# Patient Record
Sex: Female | Born: 1970 | Race: White | Hispanic: No | Smoking: Current every day smoker
Health system: Southern US, Community
[De-identification: ages and names within clinical notes are randomized; demographics above are authoritative.]

## PROBLEM LIST (undated history)

## (undated) DIAGNOSIS — N159 Renal tubulo-interstitial disease, unspecified: Secondary | ICD-10-CM

## (undated) DIAGNOSIS — I10 Essential (primary) hypertension: Secondary | ICD-10-CM

## (undated) DIAGNOSIS — E785 Hyperlipidemia, unspecified: Secondary | ICD-10-CM

## (undated) DIAGNOSIS — I38 Endocarditis, valve unspecified: Secondary | ICD-10-CM

## (undated) DIAGNOSIS — I341 Nonrheumatic mitral (valve) prolapse: Secondary | ICD-10-CM

## (undated) DIAGNOSIS — R55 Syncope and collapse: Secondary | ICD-10-CM

## (undated) DIAGNOSIS — F4310 Post-traumatic stress disorder, unspecified: Secondary | ICD-10-CM

## (undated) DIAGNOSIS — I509 Heart failure, unspecified: Secondary | ICD-10-CM

## (undated) DIAGNOSIS — F419 Anxiety disorder, unspecified: Secondary | ICD-10-CM

## (undated) DIAGNOSIS — G43909 Migraine, unspecified, not intractable, without status migrainosus: Secondary | ICD-10-CM

## (undated) HISTORY — PX: BACK SURGERY: SHX140

## (undated) HISTORY — PX: ABDOMINAL HYSTERECTOMY: SHX81

## (undated) HISTORY — PX: CARPAL TUNNEL RELEASE: SHX101

## (undated) HISTORY — PX: WRIST GANGLION EXCISION: SHX840

## (undated) HISTORY — PX: TUBAL LIGATION: SHX77

## (undated) HISTORY — PX: ESOPHAGOGASTRODUODENOSCOPY: SHX1529

---

## 1998-10-05 ENCOUNTER — Emergency Department (HOSPITAL_COMMUNITY): Admission: EM | Admit: 1998-10-05 | Discharge: 1998-10-05 | Payer: Self-pay | Admitting: Emergency Medicine

## 1998-10-10 ENCOUNTER — Encounter (HOSPITAL_COMMUNITY): Admission: RE | Admit: 1998-10-10 | Discharge: 1999-01-08 | Payer: Self-pay

## 1998-12-07 ENCOUNTER — Emergency Department (HOSPITAL_COMMUNITY): Admission: EM | Admit: 1998-12-07 | Discharge: 1998-12-07 | Payer: Self-pay | Admitting: Emergency Medicine

## 1998-12-07 ENCOUNTER — Encounter: Payer: Self-pay | Admitting: Emergency Medicine

## 2004-08-24 ENCOUNTER — Ambulatory Visit: Payer: Self-pay

## 2004-09-22 ENCOUNTER — Emergency Department: Payer: Self-pay | Admitting: Emergency Medicine

## 2004-11-06 ENCOUNTER — Emergency Department: Payer: Self-pay | Admitting: Emergency Medicine

## 2005-04-27 ENCOUNTER — Other Ambulatory Visit: Payer: Self-pay

## 2005-04-27 ENCOUNTER — Emergency Department: Payer: Self-pay | Admitting: Emergency Medicine

## 2005-08-28 ENCOUNTER — Emergency Department: Payer: Self-pay | Admitting: Emergency Medicine

## 2005-10-17 ENCOUNTER — Other Ambulatory Visit: Payer: Self-pay

## 2005-10-17 ENCOUNTER — Emergency Department: Payer: Self-pay | Admitting: Emergency Medicine

## 2006-03-12 ENCOUNTER — Emergency Department: Payer: Self-pay | Admitting: Internal Medicine

## 2006-10-19 ENCOUNTER — Emergency Department: Payer: Self-pay | Admitting: Emergency Medicine

## 2007-01-21 ENCOUNTER — Emergency Department: Payer: Self-pay | Admitting: Emergency Medicine

## 2007-10-08 ENCOUNTER — Emergency Department (HOSPITAL_COMMUNITY): Admission: EM | Admit: 2007-10-08 | Discharge: 2007-10-08 | Payer: Self-pay | Admitting: Emergency Medicine

## 2008-03-20 ENCOUNTER — Emergency Department: Payer: Self-pay | Admitting: Emergency Medicine

## 2008-06-27 ENCOUNTER — Emergency Department (HOSPITAL_COMMUNITY): Admission: EM | Admit: 2008-06-27 | Discharge: 2008-06-28 | Payer: Self-pay | Admitting: Emergency Medicine

## 2008-09-29 ENCOUNTER — Emergency Department: Payer: Self-pay | Admitting: Emergency Medicine

## 2008-11-23 ENCOUNTER — Emergency Department: Payer: Self-pay | Admitting: Emergency Medicine

## 2008-12-08 ENCOUNTER — Emergency Department: Payer: Self-pay | Admitting: Emergency Medicine

## 2009-01-05 ENCOUNTER — Emergency Department: Payer: Self-pay | Admitting: Emergency Medicine

## 2009-01-25 ENCOUNTER — Emergency Department: Payer: Self-pay | Admitting: Emergency Medicine

## 2009-01-28 ENCOUNTER — Emergency Department: Payer: Self-pay | Admitting: Emergency Medicine

## 2009-03-03 ENCOUNTER — Emergency Department: Payer: Self-pay | Admitting: Emergency Medicine

## 2009-03-09 ENCOUNTER — Emergency Department: Payer: Self-pay | Admitting: Emergency Medicine

## 2009-03-31 ENCOUNTER — Inpatient Hospital Stay: Payer: Self-pay | Admitting: Psychiatry

## 2009-04-01 ENCOUNTER — Ambulatory Visit: Payer: Self-pay | Admitting: Cardiology

## 2009-04-23 ENCOUNTER — Emergency Department (HOSPITAL_COMMUNITY): Admission: EM | Admit: 2009-04-23 | Discharge: 2009-04-23 | Payer: Self-pay | Admitting: Emergency Medicine

## 2009-06-07 ENCOUNTER — Emergency Department: Payer: Self-pay | Admitting: Emergency Medicine

## 2009-07-17 ENCOUNTER — Emergency Department: Payer: Self-pay | Admitting: Emergency Medicine

## 2009-11-23 ENCOUNTER — Inpatient Hospital Stay (HOSPITAL_COMMUNITY): Admission: EM | Admit: 2009-11-23 | Discharge: 2009-11-25 | Payer: Self-pay | Admitting: Emergency Medicine

## 2010-01-01 ENCOUNTER — Emergency Department: Payer: Self-pay | Admitting: Emergency Medicine

## 2010-02-27 ENCOUNTER — Emergency Department: Payer: Self-pay | Admitting: Emergency Medicine

## 2010-03-11 ENCOUNTER — Inpatient Hospital Stay: Payer: Self-pay | Admitting: Psychiatry

## 2010-04-03 ENCOUNTER — Ambulatory Visit: Payer: Self-pay | Admitting: Orthopedic Surgery

## 2010-04-05 ENCOUNTER — Ambulatory Visit: Payer: Self-pay | Admitting: Orthopedic Surgery

## 2010-05-09 ENCOUNTER — Ambulatory Visit: Payer: Self-pay

## 2010-05-17 ENCOUNTER — Ambulatory Visit: Payer: Self-pay | Admitting: Family Medicine

## 2010-07-30 ENCOUNTER — Emergency Department: Payer: Self-pay | Admitting: Emergency Medicine

## 2010-08-14 ENCOUNTER — Emergency Department: Payer: Self-pay | Admitting: Emergency Medicine

## 2010-10-07 ENCOUNTER — Emergency Department: Payer: Self-pay | Admitting: Emergency Medicine

## 2010-10-30 ENCOUNTER — Ambulatory Visit: Payer: Self-pay | Admitting: Gastroenterology

## 2010-10-31 LAB — PATHOLOGY REPORT

## 2010-11-06 ENCOUNTER — Emergency Department: Payer: Self-pay | Admitting: Emergency Medicine

## 2011-01-28 ENCOUNTER — Inpatient Hospital Stay: Payer: Self-pay | Admitting: Psychiatry

## 2011-01-28 DIAGNOSIS — I499 Cardiac arrhythmia, unspecified: Secondary | ICD-10-CM

## 2011-01-29 ENCOUNTER — Inpatient Hospital Stay: Payer: Self-pay | Admitting: Internal Medicine

## 2011-01-29 DIAGNOSIS — R55 Syncope and collapse: Secondary | ICD-10-CM

## 2011-02-03 LAB — COMPREHENSIVE METABOLIC PANEL
AST: 22 U/L (ref 0–37)
Albumin: 3.3 g/dL — ABNORMAL LOW (ref 3.5–5.2)
Alkaline Phosphatase: 157 U/L — ABNORMAL HIGH (ref 39–117)
BUN: 16 mg/dL (ref 6–23)
Chloride: 104 mEq/L (ref 96–112)
GFR calc Af Amer: >60 mL/min (ref >60)
Potassium: 4.7 mEq/L (ref 3.5–5.1)
Sodium: 139 mEq/L (ref 135–145)
Total Protein: 6.9 g/dL (ref 6.0–8.3)

## 2011-02-03 LAB — CK TOTAL AND CKMB (NOT AT ARMC)
CK, MB: 0.9 ng/mL (ref 0.3–4.0)
Relative Index: INVALID (ref 0.0–2.5)
Total CK: 49 U/L (ref 7–177)

## 2011-02-03 LAB — HEPATIC FUNCTION PANEL
ALT: 132 U/L — ABNORMAL HIGH (ref 0–35)
ALT: 145 U/L — ABNORMAL HIGH (ref 0–35)
AST: 30 U/L (ref 0–37)
AST: 32 U/L (ref 0–37)
Albumin: 3.4 g/dL — ABNORMAL LOW (ref 3.5–5.2)
Alkaline Phosphatase: 202 U/L — ABNORMAL HIGH (ref 39–117)
Alkaline Phosphatase: 209 U/L — ABNORMAL HIGH (ref 39–117)
Total Bilirubin: 0.3 mg/dL (ref 0.3–1.2)
Total Bilirubin: 0.3 mg/dL (ref 0.3–1.2)
Total Protein: 7.4 g/dL (ref 6.0–8.3)
Total Protein: 7.5 g/dL (ref 6.0–8.3)

## 2011-02-03 LAB — POCT I-STAT, CHEM 8
BUN: 8 mg/dL (ref 6–23)
Calcium, Ion: 1.11 mmol/L — ABNORMAL LOW (ref 1.12–1.32)
Glucose, Bld: 123 mg/dL — ABNORMAL HIGH (ref 70–99)
Hemoglobin: 14.6 g/dL (ref 12.0–15.0)
Potassium: 4.1 mEq/L (ref 3.5–5.1)
Sodium: 137 mEq/L (ref 135–145)

## 2011-02-03 LAB — URINALYSIS, ROUTINE W REFLEX MICROSCOPIC
Hgb urine dipstick: NEGATIVE
Ketones, ur: NEGATIVE mg/dL
Protein, ur: NEGATIVE mg/dL
Specific Gravity, Urine: 1.01 (ref 1.005–1.030)
Urobilinogen, UA: 0.2 mg/dL (ref 0.0–1.0)
pH: 6.5 (ref 5.0–8.0)

## 2011-02-03 LAB — DIFFERENTIAL
Basophils Relative: 1 % (ref 0–1)
Eosinophils Absolute: 0.2 10*3/uL (ref 0.0–0.7)
Monocytes Relative: 6 % (ref 3–12)

## 2011-02-03 LAB — CBC
MCHC: 35.3 g/dL (ref 30.0–36.0)
MCV: 93.5 fL (ref 78.0–100.0)
RBC: 4.39 MIL/uL (ref 3.87–5.11)
WBC: 6.3 10*3/uL (ref 4.0–10.5)

## 2011-02-03 LAB — CULTURE, BLOOD (ROUTINE X 2)

## 2011-02-03 LAB — URINE CULTURE

## 2011-02-03 LAB — PREGNANCY, URINE: Preg Test, Ur: NEGATIVE

## 2011-02-03 LAB — CARDIAC PANEL(CRET KIN+CKTOT+MB+TROPI)
CK, MB: 1.3 ng/mL (ref 0.3–4.0)
Relative Index: INVALID (ref 0.0–2.5)
Total CK: 49 U/L (ref 7–177)

## 2011-02-03 LAB — PROTIME-INR: Prothrombin Time: 12.8 seconds (ref 11.6–15.2)

## 2011-02-03 LAB — RHEUMATOID FACTOR: Rhuematoid fact SerPl-aCnc: <20 IU/mL (ref 0–20)

## 2011-02-03 LAB — HIV ANTIBODY (ROUTINE TESTING W REFLEX): HIV: NONREACTIVE

## 2011-02-03 LAB — SEDIMENTATION RATE: Sed Rate: 69 mm/hr — ABNORMAL HIGH (ref 0–22)

## 2011-02-03 LAB — TSH: TSH: 1.148 u[IU]/mL (ref 0.350–4.500)

## 2011-02-03 LAB — BRAIN NATRIURETIC PEPTIDE: Pro B Natriuretic peptide (BNP): <30 pg/mL (ref 0.0–100.0)

## 2011-02-18 ENCOUNTER — Emergency Department: Payer: Self-pay | Admitting: Emergency Medicine

## 2011-02-25 LAB — POCT I-STAT, CHEM 8
Chloride: 108 mEq/L (ref 96–112)
HCT: 38 % (ref 36.0–46.0)
Hemoglobin: 12.9 g/dL (ref 12.0–15.0)
Potassium: 3.5 mEq/L (ref 3.5–5.1)
Sodium: 140 mEq/L (ref 135–145)

## 2011-02-25 LAB — CBC
HCT: 37.8 % (ref 36.0–46.0)
Hemoglobin: 13.3 g/dL (ref 12.0–15.0)
MCV: 91.5 fL (ref 78.0–100.0)
RDW: 12.8 % (ref 11.5–15.5)

## 2011-02-25 LAB — DIFFERENTIAL
Basophils Absolute: 0.1 10*3/uL (ref 0.0–0.1)
Eosinophils Absolute: 0.2 10*3/uL (ref 0.0–0.7)
Eosinophils Relative: 2 % (ref 0–5)
Lymphocytes Relative: 38 % (ref 12–46)
Lymphs Abs: 3.3 10*3/uL (ref 0.7–4.0)
Monocytes Absolute: 0.5 10*3/uL (ref 0.1–1.0)

## 2011-02-25 LAB — URINALYSIS, ROUTINE W REFLEX MICROSCOPIC
Bilirubin Urine: NEGATIVE
Ketones, ur: 15 mg/dL — AB
Nitrite: NEGATIVE
Protein, ur: NEGATIVE mg/dL
Urobilinogen, UA: 0.2 mg/dL (ref 0.0–1.0)

## 2011-02-25 LAB — RAPID URINE DRUG SCREEN, HOSP PERFORMED
Cocaine: NOT DETECTED
Tetrahydrocannabinol: POSITIVE — AB

## 2011-02-25 LAB — GLUCOSE, CAPILLARY

## 2011-02-25 LAB — MAGNESIUM: Magnesium: 1.9 mg/dL (ref 1.5–2.5)

## 2011-02-25 LAB — ETHANOL: Alcohol, Ethyl (B): <5 mg/dL (ref 0–10)

## 2011-02-25 LAB — TROPONIN I: Troponin I: 0.01 ng/mL (ref 0.00–0.06)

## 2011-04-07 ENCOUNTER — Emergency Department: Payer: Self-pay | Admitting: Emergency Medicine

## 2011-05-10 ENCOUNTER — Ambulatory Visit: Payer: Self-pay | Admitting: Family Medicine

## 2011-05-11 ENCOUNTER — Observation Stay: Payer: Self-pay | Admitting: Internal Medicine

## 2011-05-15 ENCOUNTER — Emergency Department: Payer: Self-pay | Admitting: Emergency Medicine

## 2011-07-24 ENCOUNTER — Ambulatory Visit: Payer: Self-pay | Admitting: Orthopedic Surgery

## 2011-08-16 LAB — CBC
HCT: 38.4
Hemoglobin: 13.2
MCHC: 34.4
MCV: 92.3
Platelets: 201
RBC: 4.16
RDW: 12.9
WBC: 8.5

## 2011-08-16 LAB — URINALYSIS, ROUTINE W REFLEX MICROSCOPIC
Glucose, UA: NEGATIVE
Hgb urine dipstick: NEGATIVE
Ketones, ur: NEGATIVE
Nitrite: NEGATIVE
Protein, ur: NEGATIVE
Specific Gravity, Urine: 1.026
Urobilinogen, UA: 1
pH: 6

## 2011-08-16 LAB — POCT I-STAT, CHEM 8
BUN: 10
Calcium, Ion: 1.22
Chloride: 106
Creatinine, Ser: 1
Glucose, Bld: 97
HCT: 38
Hemoglobin: 12.9
Potassium: 4.5
Sodium: 138
TCO2: 27

## 2011-08-16 LAB — DIFFERENTIAL
Basophils Absolute: 0.1
Basophils Relative: 1
Eosinophils Absolute: 0.2
Eosinophils Relative: 3
Lymphocytes Relative: 43
Lymphs Abs: 3.7
Monocytes Absolute: 0.4
Monocytes Relative: 5
Neutro Abs: 4.1
Neutrophils Relative %: 48

## 2011-08-16 LAB — POCT CARDIAC MARKERS
CKMB, poc: <1 — ABNORMAL LOW
Myoglobin, poc: 45.4
Troponin i, poc: <0.05

## 2011-08-27 LAB — COMPREHENSIVE METABOLIC PANEL
ALT: 17
AST: 16
CO2: 25
Chloride: 106
GFR calc Af Amer: >60
GFR calc non Af Amer: >60
Potassium: 4
Sodium: 138
Total Bilirubin: 0.7

## 2011-08-27 LAB — CBC
RBC: 4.6
WBC: 8.6

## 2011-08-27 LAB — DIFFERENTIAL
Basophils Absolute: 0.1
Eosinophils Absolute: 0.4
Eosinophils Relative: 5
Monocytes Absolute: 0.4

## 2011-08-27 LAB — POCT CARDIAC MARKERS
CKMB, poc: <1 — ABNORMAL LOW
Myoglobin, poc: 39.8
Troponin i, poc: <0.05

## 2011-12-19 ENCOUNTER — Ambulatory Visit: Payer: Self-pay | Admitting: Unknown Physician Specialty

## 2011-12-19 LAB — URINALYSIS, COMPLETE
Bacteria: NONE SEEN
Bilirubin,UR: NEGATIVE
Blood: NEGATIVE
Ketone: NEGATIVE
Leukocyte Esterase: NEGATIVE
Ph: 5 (ref 4.5–8.0)
RBC,UR: <1 /HPF (ref 0–5)
Specific Gravity: 1.015 (ref 1.003–1.030)
Squamous Epithelial: <1
WBC UR: 1 /HPF (ref 0–5)

## 2011-12-19 LAB — CBC
HCT: 42.3 % (ref 35.0–47.0)
HGB: 14.4 g/dL (ref 12.0–16.0)
MCHC: 34.1 g/dL (ref 32.0–36.0)
RBC: 4.52 10*6/uL (ref 3.80–5.20)
WBC: 8.7 10*3/uL (ref 3.6–11.0)

## 2011-12-19 LAB — BASIC METABOLIC PANEL
Anion Gap: 11 (ref 7–16)
BUN: 8 mg/dL (ref 7–18)
Chloride: 105 mmol/L (ref 98–107)
Creatinine: 0.76 mg/dL (ref 0.60–1.30)
EGFR (African American): >60

## 2011-12-26 ENCOUNTER — Ambulatory Visit: Payer: Self-pay | Admitting: Unknown Physician Specialty

## 2012-09-30 ENCOUNTER — Emergency Department: Payer: Self-pay | Admitting: Emergency Medicine

## 2012-09-30 LAB — COMPREHENSIVE METABOLIC PANEL
Albumin: 3.6 g/dL (ref 3.4–5.0)
Anion Gap: 9 (ref 7–16)
Calcium, Total: 9.1 mg/dL (ref 8.5–10.1)
Chloride: 105 mmol/L (ref 98–107)
EGFR (African American): >60
Osmolality: 277 (ref 275–301)
Potassium: 3.9 mmol/L (ref 3.5–5.1)
Sodium: 139 mmol/L (ref 136–145)
Total Protein: 7.7 g/dL (ref 6.4–8.2)

## 2012-09-30 LAB — CBC
HGB: 14.8 g/dL (ref 12.0–16.0)
MCHC: 35.2 g/dL (ref 32.0–36.0)
Platelet: 201 10*3/uL (ref 150–440)
RBC: 4.52 10*6/uL (ref 3.80–5.20)
RDW: 12.4 % (ref 11.5–14.5)
WBC: 9.6 10*3/uL (ref 3.6–11.0)

## 2012-09-30 LAB — URINALYSIS, COMPLETE
Bacteria: NONE SEEN
Bilirubin,UR: NEGATIVE
Blood: NEGATIVE
Glucose,UR: NEGATIVE mg/dL (ref 0–75)
Leukocyte Esterase: NEGATIVE
Nitrite: NEGATIVE
Ph: 5 (ref 4.5–8.0)
RBC,UR: 1 /HPF (ref 0–5)
Squamous Epithelial: 1

## 2012-09-30 LAB — CK TOTAL AND CKMB (NOT AT ARMC)
CK, Total: 79 U/L (ref 21–215)
CK-MB: 0.5 ng/mL (ref 0.5–3.6)

## 2012-11-04 ENCOUNTER — Emergency Department: Payer: Self-pay | Admitting: Emergency Medicine

## 2012-11-04 LAB — URINALYSIS, COMPLETE
Leukocyte Esterase: NEGATIVE
Nitrite: NEGATIVE
Squamous Epithelial: 3

## 2012-11-04 LAB — COMPREHENSIVE METABOLIC PANEL
Albumin: 4 g/dL (ref 3.4–5.0)
Alkaline Phosphatase: 123 U/L (ref 50–136)
Anion Gap: 2 — ABNORMAL LOW (ref 7–16)
Calcium, Total: 9.5 mg/dL (ref 8.5–10.1)
EGFR (Non-African Amer.): >60
Glucose: 109 mg/dL — ABNORMAL HIGH (ref 65–99)
Potassium: 3.9 mmol/L (ref 3.5–5.1)
SGOT(AST): 18 U/L (ref 15–37)
Sodium: 137 mmol/L (ref 136–145)

## 2012-11-04 LAB — CBC
HGB: 14.1 g/dL (ref 12.0–16.0)
MCV: 92 fL (ref 80–100)
Platelet: 258 10*3/uL (ref 150–440)
RBC: 4.62 10*6/uL (ref 3.80–5.20)
WBC: 8.4 10*3/uL (ref 3.6–11.0)

## 2013-05-11 ENCOUNTER — Encounter (HOSPITAL_COMMUNITY): Payer: Self-pay

## 2013-05-11 ENCOUNTER — Other Ambulatory Visit: Payer: Self-pay

## 2013-05-11 ENCOUNTER — Emergency Department (HOSPITAL_COMMUNITY): Payer: Medicaid Other

## 2013-05-11 ENCOUNTER — Emergency Department (HOSPITAL_COMMUNITY)
Admission: EM | Admit: 2013-05-11 | Discharge: 2013-05-12 | Disposition: A | Discharge status: Home / Self Care | Payer: Medicaid Other | Attending: Emergency Medicine | Admitting: Emergency Medicine

## 2013-05-11 DIAGNOSIS — Y9269 Other specified industrial and construction area as the place of occurrence of the external cause: Secondary | ICD-10-CM | POA: Insufficient documentation

## 2013-05-11 DIAGNOSIS — Y990 Civilian activity done for income or pay: Secondary | ICD-10-CM | POA: Insufficient documentation

## 2013-05-11 DIAGNOSIS — Y939 Activity, unspecified: Secondary | ICD-10-CM | POA: Insufficient documentation

## 2013-05-11 DIAGNOSIS — R55 Syncope and collapse: Secondary | ICD-10-CM | POA: Insufficient documentation

## 2013-05-11 DIAGNOSIS — I10 Essential (primary) hypertension: Secondary | ICD-10-CM | POA: Insufficient documentation

## 2013-05-11 DIAGNOSIS — Z3202 Encounter for pregnancy test, result negative: Secondary | ICD-10-CM | POA: Insufficient documentation

## 2013-05-11 DIAGNOSIS — F17200 Nicotine dependence, unspecified, uncomplicated: Secondary | ICD-10-CM | POA: Insufficient documentation

## 2013-05-11 DIAGNOSIS — Z8679 Personal history of other diseases of the circulatory system: Secondary | ICD-10-CM | POA: Insufficient documentation

## 2013-05-11 DIAGNOSIS — S069X9A Unspecified intracranial injury with loss of consciousness of unspecified duration, initial encounter: Secondary | ICD-10-CM

## 2013-05-11 DIAGNOSIS — R296 Repeated falls: Secondary | ICD-10-CM | POA: Insufficient documentation

## 2013-05-11 DIAGNOSIS — Z8659 Personal history of other mental and behavioral disorders: Secondary | ICD-10-CM | POA: Insufficient documentation

## 2013-05-11 DIAGNOSIS — S060X1A Concussion with loss of consciousness of 30 minutes or less, initial encounter: Secondary | ICD-10-CM | POA: Insufficient documentation

## 2013-05-11 DIAGNOSIS — M542 Cervicalgia: Secondary | ICD-10-CM | POA: Insufficient documentation

## 2013-05-11 HISTORY — DX: Anxiety disorder, unspecified: F41.9

## 2013-05-11 HISTORY — DX: Nonrheumatic mitral (valve) prolapse: I34.1

## 2013-05-11 HISTORY — DX: Essential (primary) hypertension: I10

## 2013-05-11 LAB — POCT I-STAT, CHEM 8
BUN: 13 mg/dL (ref 6–23)
Calcium, Ion: 1.13 mmol/L (ref 1.12–1.23)
Chloride: 104 mEq/L (ref 96–112)
HCT: 40 % (ref 36.0–46.0)
Potassium: 3.7 mEq/L (ref 3.5–5.1)

## 2013-05-11 MED ORDER — SODIUM CHLORIDE 0.9 % IV BOLUS (SEPSIS)
1000.0000 mL | Freq: Once | INTRAVENOUS | Status: AC
  Administered 2013-05-11: 1000 mL via INTRAVENOUS

## 2013-05-11 MED ORDER — METOCLOPRAMIDE HCL 5 MG/ML IJ SOLN
10.0000 mg | Freq: Once | INTRAMUSCULAR | Status: AC
  Administered 2013-05-11: 10 mg via INTRAVENOUS
  Filled 2013-05-11: qty 2

## 2013-05-11 MED ORDER — HYDROMORPHONE HCL PF 1 MG/ML IJ SOLN
1.0000 mg | Freq: Once | INTRAMUSCULAR | Status: AC
  Administered 2013-05-11: 1 mg via INTRAVENOUS
  Filled 2013-05-11: qty 1

## 2013-05-11 MED ORDER — DIPHENHYDRAMINE HCL 50 MG/ML IJ SOLN
50.0000 mg | Freq: Once | INTRAMUSCULAR | Status: AC
  Administered 2013-05-11: 50 mg via INTRAVENOUS
  Filled 2013-05-11: qty 1

## 2013-05-11 MED ORDER — DEXAMETHASONE SODIUM PHOSPHATE 10 MG/ML IJ SOLN
10.0000 mg | Freq: Once | INTRAMUSCULAR | Status: AC
  Administered 2013-05-11: 10 mg via INTRAVENOUS
  Filled 2013-05-11: qty 1

## 2013-05-11 NOTE — ED Notes (Signed)
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 Did not fall or hit her head. Per staff, pt just slumped over briefly on the counter. Patient c/o headache all day. No relief in spite of multiple OTC meds (aleve, BC powder). Describes headache as intense. Located to the left temporal area, left face and left neck. Also has some blurry vision to left eye. +photophobia, nausea and dizziness. Denies phonophobia, vomiting, CP, abd pain or SOB.

## 2013-05-11 NOTE — ED Notes (Signed)
Patient back from CT.

## 2013-05-11 NOTE — ED Notes (Signed)
Patient transported to CT 

## 2013-05-11 NOTE — ED Provider Notes (Signed)
History    CSN: [id number] Arrival date & time 05/11/13  2231  First MD Initiated Contact with Patient 05/11/13 2243     Chief Complaint  Patient presents with  . Loss of Consciousness  . Headache   (Consider location/radiation/quality/duration/timing/severity/associated sxs/prior Treatment) HPI This 42 year old female has chronic neck pain chronic low back pain, used to get headaches much worse than this one and had them intermittently for years but has not had one in several months, gradual onset 4 days headache mostly left-sided throbbing and burning more intense tonight at work and she just remembers waking up on the floor, she has a history of vasovagal syncope multiple times in the past and last time about 6 months ago she states, it is unknown whether or not she hit her head tonight during her syncopal episode, no fever no change in speech vision swallowing or understanding or focal weakness numbness or incoordination tonight   Past Medical History  Diagnosis Date  . Anxiety   . Mitral valve prolapse   . Hypertension    History reviewed. No pertinent past surgical history. No family history on file. History  Substance Use Topics  . Smoking status: Current Every Day Smoker -- 1.50 packs/day    Types: Cigarettes  . Smokeless tobacco: Never Used  . Alcohol Use: Yes   OB History   Grav Para Term Preterm Abortions TAB SAB Ect Mult Living                 Review of Systems 10 Systems reviewed and are negative for acute change except as noted in the HPI. Allergies  Sulfa antibiotics  Home Medications   Current Outpatient Rx  Name  Route  Sig  Dispense  Refill  . acetaminophen (TYLENOL) 325 MG tablet   Oral   Take 650 mg by mouth every 6 (six) hours as needed for pain.         . naproxen sodium (ANAPROX) 220 MG tablet   Oral   Take 220 mg by mouth 2 (two) times daily as needed (for pain).         Marland Kitchen metoCLOPramide (REGLAN) 10 MG tablet   Oral   Take 1 tablet  (10 mg total) by mouth every 6 (six) hours as needed (nausea/headache).   6 tablet   0   . oxyCODONE-acetaminophen (PERCOCET) 5-325 MG per tablet   Oral   Take 2 tablets by mouth every 4 (four) hours as needed for pain.   10 tablet   0    BP 129/65  Pulse 64  Temp(Src) 98.5 F (36.9 C) (Oral)  Resp 18  SpO2 100% Physical Exam  Nursing note and vitals reviewed. Constitutional:  Awake, alert, nontoxic appearance with baseline speech for patient.  HENT:  Head: Atraumatic.  Mouth/Throat: No oropharyngeal exudate.  Eyes: EOM are normal. Pupils are equal, round, and reactive to light. Right eye exhibits no discharge. Left eye exhibits no discharge.  Neck: Neck supple.  Cervical spine tender   Cardiovascular: Normal rate and regular rhythm.   No murmur heard. Pulmonary/Chest: Effort normal and breath sounds normal. No stridor. No respiratory distress. She has no wheezes. She has no rales. She exhibits no tenderness.  Abdominal: Soft. Bowel sounds are normal. She exhibits no mass. There is no tenderness. There is no rebound.  Musculoskeletal: She exhibits no tenderness.  Baseline ROM, moves extremities with no obvious new focal weakness.  Lymphadenopathy:    She has no cervical adenopathy.  Neurological: She  is alert.  Awake, alert, cooperative and aware of situation; motor strength bilaterally; sensation normal to light touch bilaterally; peripheral visual fields full to confrontation; no facial asymmetry; tongue midline; major cranial nerves appear intact; no pronator drift, normal finger to nose bilaterally, gait not initially tested  Skin: No rash noted.  Psychiatric: She has a normal mood and affect.    ED Course  Procedures (including critical care time) ECG: Sinus rhythm, ventricular rate 65, normal axis, normal intervals, no acute ischemic changes noted, no comparison immediately available  Patient walking normally in the emergency department.  Labs Reviewed  URINE  RAPID DRUG SCREEN (HOSP PERFORMED) - Abnormal; Notable for the following:    Cocaine POSITIVE (*)    Tetrahydrocannabinol POSITIVE (*)    All other components within normal limits  GLUCOSE, CAPILLARY  POCT I-STAT, CHEM 8  POCT PREGNANCY, URINE   Dg Chest 2 View  05/13/2013   *RADIOLOGY REPORT*  Clinical Data:  Chest pain and shortness of breath.  CHEST - 2 VIEW  Comparison: 09/07/2012  Findings: The heart size and mediastinal contours are within normal limits.  Both lungs are clear.  The visualized skeletal structures are unremarkable.  IMPRESSION: No active disease.   Original Report Authenticated By: Irish Lack, M.D.   Ct Head Wo Contrast  05/11/2013   *RADIOLOGY REPORT*  Clinical Data:  Loss of consciousness, syncope, headache.  CT HEAD WITHOUT CONTRAST CT CERVICAL SPINE WITHOUT CONTRAST  Technique:  Multidetector CT imaging of the head and cervical spine was performed following the standard protocol without intravenous contrast.  Multiplanar CT image reconstructions of the cervical spine were also generated.  Comparison:  05/05/2012  CT HEAD  Findings: No acute intracranial abnormality.  Specifically, no hemorrhage, hydrocephalus, mass lesion, acute infarction, or significant intracranial injury.  No acute calvarial abnormality. Visualized paranasal sinuses and mastoids clear.  Orbital soft tissues unremarkable.  IMPRESSION: Negative exam  CT CERVICAL SPINE  Findings: Prior anterior fusion at C5-6.  No hardware complicating feature.  Normal alignment.  Prevertebral soft tissues are normal. No fracture.  No epidural or paraspinal hematoma.  IMPRESSION: No acute bony abnormality.  Prior ACDF C5-6.   Original Report Authenticated By: Charlett Nose, M.D.   Ct Cervical Spine Wo Contrast  05/11/2013   *RADIOLOGY REPORT*  Clinical Data:  Loss of consciousness, syncope, headache.  CT HEAD WITHOUT CONTRAST CT CERVICAL SPINE WITHOUT CONTRAST  Technique:  Multidetector CT imaging of the head and cervical  spine was performed following the standard protocol without intravenous contrast.  Multiplanar CT image reconstructions of the cervical spine were also generated.  Comparison:  05/05/2012  CT HEAD  Findings: No acute intracranial abnormality.  Specifically, no hemorrhage, hydrocephalus, mass lesion, acute infarction, or significant intracranial injury.  No acute calvarial abnormality. Visualized paranasal sinuses and mastoids clear.  Orbital soft tissues unremarkable.  IMPRESSION: Negative exam  CT CERVICAL SPINE  Findings: Prior anterior fusion at C5-6.  No hardware complicating feature.  Normal alignment.  Prevertebral soft tissues are normal. No fracture.  No epidural or paraspinal hematoma.  IMPRESSION: No acute bony abnormality.  Prior ACDF C5-6.   Original Report Authenticated By: Charlett Nose, M.D.   1. Headache   2. Syncope   3. Minor head injury with loss of consciousness, initial encounter   4. Chronic neck pain     MDM  I doubt any other EMC precluding discharge at this time including, but not necessarily limited to the following:SAH, CVA, CSI, Vtach.  Hurman Horn,  MD 05/13/13 1756

## 2013-05-12 LAB — RAPID URINE DRUG SCREEN, HOSP PERFORMED
Barbiturates: NOT DETECTED
Cocaine: POSITIVE — AB
Opiates: NOT DETECTED
Tetrahydrocannabinol: POSITIVE — AB

## 2013-05-12 LAB — POCT PREGNANCY, URINE: Preg Test, Ur: NEGATIVE

## 2013-05-12 MED ORDER — METOCLOPRAMIDE HCL 10 MG PO TABS: 10.0000 mg | ORAL_TABLET | Freq: Four times a day (QID) | ORAL | Status: DC | PRN

## 2013-05-12 MED ORDER — OXYCODONE-ACETAMINOPHEN 5-325 MG PO TABS: 2.0000 | ORAL_TABLET | ORAL | Status: DC | PRN

## 2013-05-13 ENCOUNTER — Encounter (HOSPITAL_COMMUNITY): Payer: Self-pay | Admitting: *Deleted

## 2013-05-13 ENCOUNTER — Emergency Department (HOSPITAL_COMMUNITY)
Admission: EM | Admit: 2013-05-13 | Discharge: 2013-05-13 | Disposition: A | Discharge status: Home / Self Care | Payer: Medicaid Other | Attending: Emergency Medicine | Admitting: Emergency Medicine

## 2013-05-13 ENCOUNTER — Emergency Department (HOSPITAL_COMMUNITY): Payer: Medicaid Other

## 2013-05-13 DIAGNOSIS — R112 Nausea with vomiting, unspecified: Secondary | ICD-10-CM | POA: Insufficient documentation

## 2013-05-13 DIAGNOSIS — F411 Generalized anxiety disorder: Secondary | ICD-10-CM | POA: Insufficient documentation

## 2013-05-13 DIAGNOSIS — F17200 Nicotine dependence, unspecified, uncomplicated: Secondary | ICD-10-CM | POA: Insufficient documentation

## 2013-05-13 DIAGNOSIS — Z8679 Personal history of other diseases of the circulatory system: Secondary | ICD-10-CM | POA: Insufficient documentation

## 2013-05-13 DIAGNOSIS — I10 Essential (primary) hypertension: Secondary | ICD-10-CM | POA: Insufficient documentation

## 2013-05-13 DIAGNOSIS — Z79899 Other long term (current) drug therapy: Secondary | ICD-10-CM | POA: Insufficient documentation

## 2013-05-13 DIAGNOSIS — R0789 Other chest pain: Secondary | ICD-10-CM

## 2013-05-13 DIAGNOSIS — M549 Dorsalgia, unspecified: Secondary | ICD-10-CM | POA: Insufficient documentation

## 2013-05-13 DIAGNOSIS — R071 Chest pain on breathing: Secondary | ICD-10-CM | POA: Insufficient documentation

## 2013-05-13 LAB — CBC
MCH: 31.5 pg (ref 26.0–34.0)
MCHC: 34.8 g/dL (ref 30.0–36.0)
Platelets: 193 10*3/uL (ref 150–400)
RDW: 13 % (ref 11.5–15.5)

## 2013-05-13 LAB — HEPATIC FUNCTION PANEL
AST: 15 U/L (ref 0–37)
Bilirubin, Direct: <0.1 mg/dL (ref 0.0–0.3)
Total Bilirubin: 0.1 mg/dL — ABNORMAL LOW (ref 0.3–1.2)

## 2013-05-13 LAB — BASIC METABOLIC PANEL
Calcium: 8.9 mg/dL (ref 8.4–10.5)
GFR calc non Af Amer: 61 mL/min — ABNORMAL LOW (ref >90)
Glucose, Bld: 100 mg/dL — ABNORMAL HIGH (ref 70–99)
Sodium: 139 mEq/L (ref 135–145)

## 2013-05-13 LAB — POCT I-STAT TROPONIN I: Troponin i, poc: 0.01 ng/mL (ref 0.00–0.08)

## 2013-05-13 MED ORDER — ONDANSETRON 4 MG PO TBDP
8.0000 mg | ORAL_TABLET | Freq: Once | ORAL | Status: AC
  Administered 2013-05-13: 8 mg via ORAL
  Filled 2013-05-13: qty 2

## 2013-05-13 MED ORDER — TRAMADOL HCL 50 MG PO TABS: 50.0000 mg | ORAL_TABLET | Freq: Four times a day (QID) | ORAL | Status: DC | PRN

## 2013-05-13 NOTE — ED Provider Notes (Signed)
Medical screening examination/treatment/procedure(s) were performed by non-physician practitioner and as supervising physician I was immediately available for consultation/collaboration.   Bettie Capistran, MD 05/13/13 2349 

## 2013-05-13 NOTE — ED Provider Notes (Signed)
This is a signout from Dr. Rosalia Hammers at shift change: Monique Greer is a 42 y.o. female complaining of posterior thoracic back pain associated with neck pain and headache. Patient has remote cervical surgery. On physical exam patient also has epigastric pain that radiates to the back with tenderness to palpation of the right upper quadrant and epigastrium. Plan is to followup LFTs and lipase.  LFTs and lipase show no abnormality.  Patient's room to discuss the results of her blood work and imaging. Patient was very upset, cursing feeling that she has been treated poorly. I have attempted to discuss this with the patient who states that she would like her paperwork and to leave immediately. I have encouraged her to return at any time if her symptoms worsen or change.  Pt is hemodynamically stable, appropriate for, and amenable to discharge at this time. Pt verbalized understanding and agrees with care plan. Outpatient follow-up and specific return precautions discussed.    Discharge Medication List as of 05/13/2013  9:17 PM    START taking these medications   Details  traMADol (ULTRAM) 50 MG tablet Take 1 tablet (50 mg total) by mouth every 6 (six) hours as needed for pain., Starting 05/13/2013, Until Discontinued, [employer], PA-C 05/13/13 2312

## 2013-05-13 NOTE — ED Provider Notes (Signed)
History    CSN: [id number] Arrival date & time 05/13/13  1613  First MD Initiated Contact with Patient 05/13/13 1859     Chief Complaint  Patient presents with  . Chest Pain   (Consider location/radiation/quality/duration/timing/severity/associated sxs/prior Treatment) HPI Patient with 5 day history of mid upper back pain.  She has nausea and vomiting intermittently since the same day.  She took a goody powder and had vomtiing-nonbilious, nonbloody after.  She has not had any recent trauma but did play on the slip 'n slide two weeks ago.  She had previous cervical surgery and states she has had some tightness to the left frontal head which she associates with this.  She drank alcohol Friday night prior to the onset of the back pain.  She denies excess alcohol use.  She has epigastric pain that radiates through to the back.  She was seen here Tuesday for this same series of events.  At that time she had a head and neck ct and cxr.    Past Medical History  Diagnosis Date  . Anxiety   . Mitral valve prolapse   . Hypertension    History reviewed. No pertinent past surgical history. History reviewed. No pertinent family history. History  Substance Use Topics  . Smoking status: Current Every Day Smoker -- 1.50 packs/day    Types: Cigarettes  . Smokeless tobacco: Never Used  . Alcohol Use: Yes   OB History   Grav Para Term Preterm Abortions TAB SAB Ect Mult Living                 Review of Systems  All other systems reviewed and are negative.    Allergies  Sulfa antibiotics  Home Medications   Current Outpatient Rx  Name  Route  Sig  Dispense  Refill  . acetaminophen (TYLENOL) 325 MG tablet   Oral   Take 650 mg by mouth every 6 (six) hours as needed for pain.         Marland Kitchen metoCLOPramide (REGLAN) 10 MG tablet   Oral   Take 1 tablet (10 mg total) by mouth every 6 (six) hours as needed (nausea/headache).   6 tablet   0   . naproxen sodium (ANAPROX) 220 MG tablet    Oral   Take 220 mg by mouth 2 (two) times daily as needed (for pain).         Marland Kitchen oxyCODONE-acetaminophen (PERCOCET) 5-325 MG per tablet   Oral   Take 2 tablets by mouth every 4 (four) hours as needed for pain.   10 tablet   0    BP 138/84  Pulse 56  Temp(Src) 98.7 F (37.1 C) (Oral)  Resp 16  SpO2 99% Physical Exam  Nursing note and vitals reviewed. Constitutional: She is oriented to person, place, and time. She appears well-developed and well-nourished.  HENT:  Head: Normocephalic and atraumatic.  Right Ear: External ear normal.  Left Ear: External ear normal.  Nose: Nose normal.  Mouth/Throat: Oropharynx is clear and moist.  Eyes: Conjunctivae and EOM are normal. Pupils are equal, round, and reactive to light.  Neck: Normal range of motion. Neck supple.  Cardiovascular: Normal rate, regular rhythm, normal heart sounds and intact distal pulses.   Pulmonary/Chest: Effort normal and breath sounds normal.  Abdominal: Soft. Bowel sounds are normal. There is tenderness.  Epigastric and ruq ttp, moderate  Musculoskeletal: Normal range of motion.  Neurological: She is alert and oriented to person, place, and time. She has  normal strength and normal reflexes. She displays no atrophy. No sensory deficit. She exhibits normal muscle tone. She displays a negative Romberg sign. GCS eye subscore is 4. GCS verbal subscore is 5. GCS motor subscore is 6.  Skin: Skin is warm and dry.  Psychiatric: She has a normal mood and affect. Her behavior is normal. Thought content normal.    ED Course  Procedures (including critical care time) Labs Reviewed  CBC - Abnormal; Notable for the following:    WBC 11.8 (*)    All other components within normal limits  BASIC METABOLIC PANEL - Abnormal; Notable for the following:    Glucose, Bld 100 (*)    Creatinine, Ser 1.12 (*)    GFR calc non Af Amer 61 (*)    GFR calc Af Amer 70 (*)    All other components within normal limits  LIPASE, BLOOD   HEPATIC FUNCTION PANEL  POCT I-STAT TROPONIN I   Dg Chest 2 View  05/13/2013   *RADIOLOGY REPORT*  Clinical Data:  Chest pain and shortness of breath.  CHEST - 2 VIEW  Comparison: 09/07/2012  Findings: The heart size and mediastinal contours are within normal limits.  Both lungs are clear.  The visualized skeletal structures are unremarkable.  IMPRESSION: No active disease.   Original Report Authenticated By: Irish Lack, M.D.   Ct Head Wo Contrast  05/11/2013   *RADIOLOGY REPORT*  Clinical Data:  Loss of consciousness, syncope, headache.  CT HEAD WITHOUT CONTRAST CT CERVICAL SPINE WITHOUT CONTRAST  Technique:  Multidetector CT imaging of the head and cervical spine was performed following the standard protocol without intravenous contrast.  Multiplanar CT image reconstructions of the cervical spine were also generated.  Comparison:  05/05/2012  CT HEAD  Findings: No acute intracranial abnormality.  Specifically, no hemorrhage, hydrocephalus, mass lesion, acute infarction, or significant intracranial injury.  No acute calvarial abnormality. Visualized paranasal sinuses and mastoids clear.  Orbital soft tissues unremarkable.  IMPRESSION: Negative exam  CT CERVICAL SPINE  Findings: Prior anterior fusion at C5-6.  No hardware complicating feature.  Normal alignment.  Prevertebral soft tissues are normal. No fracture.  No epidural or paraspinal hematoma.  IMPRESSION: No acute bony abnormality.  Prior ACDF C5-6.   Original Report Authenticated By: Charlett Nose, M.D.   Ct Cervical Spine Wo Contrast  05/11/2013   *RADIOLOGY REPORT*  Clinical Data:  Loss of consciousness, syncope, headache.  CT HEAD WITHOUT CONTRAST CT CERVICAL SPINE WITHOUT CONTRAST  Technique:  Multidetector CT imaging of the head and cervical spine was performed following the standard protocol without intravenous contrast.  Multiplanar CT image reconstructions of the cervical spine were also generated.  Comparison:  05/05/2012  CT HEAD   Findings: No acute intracranial abnormality.  Specifically, no hemorrhage, hydrocephalus, mass lesion, acute infarction, or significant intracranial injury.  No acute calvarial abnormality. Visualized paranasal sinuses and mastoids clear.  Orbital soft tissues unremarkable.  IMPRESSION: Negative exam  CT CERVICAL SPINE  Findings: Prior anterior fusion at C5-6.  No hardware complicating feature.  Normal alignment.  Prevertebral soft tissues are normal. No fracture.  No epidural or paraspinal hematoma.  IMPRESSION: No acute bony abnormality.  Prior ACDF C5-6.   Original Report Authenticated By: Charlett Nose, M.D.   No diagnosis found.  MDM  Patient discussed with Ms. Lynetta Mare and she will follow up labs. Plan- reevaluate patient after labs, if exam continues to appear normal she will be discharged and advised follow up and abstain from cocaine use.  She was counseled regarding tobacco use and states "I love to smoke, I'd smoke with my last breath"    Hilario Quarry, MD 05/13/13 2032

## 2013-05-13 NOTE — ED Notes (Signed)
Patient requesting medication for nausea at this time. Will make EDP aware.

## 2013-05-13 NOTE — ED Notes (Signed)
Pt reports being seen here two nights ago for syncope. Now having pain that radiates between her shoulder blades and into her upper chest and also has tingling and pain to bilateral arms. ekg done at triage. No acute distress noted at this time.

## 2013-05-13 NOTE — ED Notes (Signed)
Pt updated on care 

## 2013-05-30 ENCOUNTER — Observation Stay: Payer: Self-pay | Admitting: Internal Medicine

## 2013-05-30 LAB — URINALYSIS, COMPLETE
Bacteria: NONE SEEN
Ketone: NEGATIVE
Nitrite: NEGATIVE
Ph: 6 (ref 4.5–8.0)
Protein: NEGATIVE
Specific Gravity: 1.009 (ref 1.003–1.030)

## 2013-05-30 LAB — COMPREHENSIVE METABOLIC PANEL
BUN: 9 mg/dL (ref 7–18)
Calcium, Total: 9.2 mg/dL (ref 8.5–10.1)
Co2: 25 mmol/L (ref 21–32)
EGFR (African American): >60
Glucose: 96 mg/dL (ref 65–99)
Osmolality: 276 (ref 275–301)
SGOT(AST): 15 U/L (ref 15–37)
SGPT (ALT): 36 U/L (ref 12–78)
Sodium: 139 mmol/L (ref 136–145)
Total Protein: 7.4 g/dL (ref 6.4–8.2)

## 2013-05-30 LAB — DRUG SCREEN, URINE
Amphetamines, Ur Screen: NEGATIVE (ref ?–1000)
Barbiturates, Ur Screen: NEGATIVE (ref ?–200)
Benzodiazepine, Ur Scrn: NEGATIVE (ref ?–200)
Cocaine Metabolite,Ur ~~LOC~~: NEGATIVE (ref ?–300)
MDMA (Ecstasy)Ur Screen: NEGATIVE (ref ?–500)
Phencyclidine (PCP) Ur S: NEGATIVE (ref ?–25)
Tricyclic, Ur Screen: NEGATIVE (ref ?–1000)

## 2013-05-30 LAB — CK TOTAL AND CKMB (NOT AT ARMC): CK, Total: 53 U/L (ref 21–215)

## 2013-05-30 LAB — CBC
HCT: 40.1 % (ref 35.0–47.0)
MCHC: 35.1 g/dL (ref 32.0–36.0)
MCV: 91 fL (ref 80–100)

## 2013-05-31 LAB — BASIC METABOLIC PANEL
Anion Gap: 6 — ABNORMAL LOW (ref 7–16)
Chloride: 110 mmol/L — ABNORMAL HIGH (ref 98–107)
Co2: 25 mmol/L (ref 21–32)
EGFR (African American): >60
Osmolality: 281 (ref 275–301)
Potassium: 3.7 mmol/L (ref 3.5–5.1)
Sodium: 141 mmol/L (ref 136–145)

## 2013-05-31 LAB — CBC WITH DIFFERENTIAL/PLATELET
Basophil #: 0 10*3/uL (ref 0.0–0.1)
Basophil %: 0.4 %
Eosinophil #: 0.2 10*3/uL (ref 0.0–0.7)
HGB: 12.5 g/dL (ref 12.0–16.0)
Lymphocyte %: 43.6 %
MCH: 31.3 pg (ref 26.0–34.0)
Neutrophil %: 49 %
Platelet: 159 10*3/uL (ref 150–440)
RBC: 4 10*6/uL (ref 3.80–5.20)
RDW: 13.3 % (ref 11.5–14.5)

## 2013-05-31 LAB — LIPID PANEL: VLDL Cholesterol, Calc: 41 mg/dL — ABNORMAL HIGH (ref 5–40)

## 2013-05-31 LAB — TROPONIN I: Troponin-I: <0.02 ng/mL

## 2013-07-06 ENCOUNTER — Emergency Department: Payer: Self-pay | Admitting: Emergency Medicine

## 2013-07-06 LAB — URINALYSIS, COMPLETE
Bilirubin,UR: NEGATIVE
Glucose,UR: NEGATIVE mg/dL (ref 0–75)
Specific Gravity: 1.025 (ref 1.003–1.030)

## 2013-07-06 LAB — COMPREHENSIVE METABOLIC PANEL
Albumin: 3.9 g/dL (ref 3.4–5.0)
Co2: 21 mmol/L (ref 21–32)
EGFR (African American): >60
EGFR (Non-African Amer.): >60
Potassium: 3.5 mmol/L (ref 3.5–5.1)
SGPT (ALT): 16 U/L (ref 12–78)
Sodium: 139 mmol/L (ref 136–145)
Total Protein: 7.5 g/dL (ref 6.4–8.2)

## 2013-07-06 LAB — CBC
HGB: 15.3 g/dL (ref 12.0–16.0)
MCHC: 35.7 g/dL (ref 32.0–36.0)
Platelet: 185 10*3/uL (ref 150–440)

## 2013-08-26 ENCOUNTER — Emergency Department: Payer: Self-pay | Admitting: Emergency Medicine

## 2013-08-26 LAB — COMPREHENSIVE METABOLIC PANEL
Alkaline Phosphatase: 113 U/L (ref 50–136)
Anion Gap: 6 — ABNORMAL LOW (ref 7–16)
Calcium, Total: 9.1 mg/dL (ref 8.5–10.1)
Chloride: 109 mmol/L — ABNORMAL HIGH (ref 98–107)
Co2: 22 mmol/L (ref 21–32)
Creatinine: 0.73 mg/dL (ref 0.60–1.30)
EGFR (African American): >60
EGFR (Non-African Amer.): >60
Osmolality: 272 (ref 275–301)
Potassium: 4.1 mmol/L (ref 3.5–5.1)
SGOT(AST): 54 U/L — ABNORMAL HIGH (ref 15–37)

## 2013-08-26 LAB — DRUG SCREEN, URINE
Barbiturates, Ur Screen: NEGATIVE (ref ?–200)
Benzodiazepine, Ur Scrn: NEGATIVE (ref ?–200)
Cannabinoid 50 Ng, Ur ~~LOC~~: POSITIVE (ref ?–50)
Cocaine Metabolite,Ur ~~LOC~~: POSITIVE (ref ?–300)
Methadone, Ur Screen: NEGATIVE (ref ?–300)
Phencyclidine (PCP) Ur S: NEGATIVE (ref ?–25)
Tricyclic, Ur Screen: POSITIVE (ref ?–1000)

## 2013-08-26 LAB — TSH: Thyroid Stimulating Horm: 1.15 u[IU]/mL

## 2013-08-26 LAB — URINALYSIS, COMPLETE
Bacteria: NONE SEEN
Blood: NEGATIVE
Glucose,UR: NEGATIVE mg/dL (ref 0–75)
Nitrite: NEGATIVE
Ph: 7 (ref 4.5–8.0)
Protein: NEGATIVE
Squamous Epithelial: 5

## 2013-08-26 LAB — ACETAMINOPHEN LEVEL: Acetaminophen: <2 ug/mL

## 2013-08-26 LAB — CBC
MCH: 31.9 pg (ref 26.0–34.0)
MCHC: 35 g/dL (ref 32.0–36.0)
RDW: 13.1 % (ref 11.5–14.5)
WBC: 7.8 10*3/uL (ref 3.6–11.0)

## 2013-08-26 LAB — ETHANOL
Ethanol %: <0.003 % (ref 0.000–0.080)
Ethanol: <3 mg/dL

## 2013-10-04 ENCOUNTER — Ambulatory Visit: Payer: Self-pay | Admitting: Orthopedic Surgery

## 2013-10-07 ENCOUNTER — Ambulatory Visit: Payer: Self-pay | Admitting: Orthopedic Surgery

## 2013-10-07 LAB — DRUG SCREEN, URINE
Amphetamines, Ur Screen: NEGATIVE (ref ?–1000)
Benzodiazepine, Ur Scrn: NEGATIVE (ref ?–200)
Cannabinoid 50 Ng, Ur ~~LOC~~: POSITIVE (ref ?–50)
Cocaine Metabolite,Ur ~~LOC~~: NEGATIVE (ref ?–300)
MDMA (Ecstasy)Ur Screen: NEGATIVE (ref ?–500)
Methadone, Ur Screen: NEGATIVE (ref ?–300)
Phencyclidine (PCP) Ur S: NEGATIVE (ref ?–25)
Tricyclic, Ur Screen: NEGATIVE (ref ?–1000)

## 2013-11-23 ENCOUNTER — Emergency Department: Payer: Self-pay | Admitting: Emergency Medicine

## 2013-11-23 LAB — URINALYSIS, COMPLETE
BACTERIA: NONE SEEN
BILIRUBIN, UR: NEGATIVE
BLOOD: NEGATIVE
Glucose,UR: NEGATIVE mg/dL (ref 0–75)
Ketone: NEGATIVE
LEUKOCYTE ESTERASE: NEGATIVE
NITRITE: NEGATIVE
PROTEIN: NEGATIVE
Ph: 8 (ref 4.5–8.0)
RBC,UR: 1 /HPF (ref 0–5)
SPECIFIC GRAVITY: 1.019 (ref 1.003–1.030)
Squamous Epithelial: 8
WBC UR: <1 /HPF (ref 0–5)

## 2013-11-23 LAB — HEPATIC FUNCTION PANEL A (ARMC)
ALK PHOS: 75 U/L
ALT: 19 U/L (ref 12–78)
AST: 16 U/L (ref 15–37)
Albumin: 3.5 g/dL (ref 3.4–5.0)
Bilirubin, Direct: <0.1 mg/dL (ref 0.00–0.20)
Bilirubin,Total: 0.2 mg/dL (ref 0.2–1.0)
Total Protein: 7.3 g/dL (ref 6.4–8.2)

## 2013-11-23 LAB — CBC
HCT: 42.3 % (ref 35.0–47.0)
HGB: 14.6 g/dL (ref 12.0–16.0)
MCH: 31.2 pg (ref 26.0–34.0)
MCHC: 34.4 g/dL (ref 32.0–36.0)
MCV: 91 fL (ref 80–100)
Platelet: 211 10*3/uL (ref 150–440)
RBC: 4.67 10*6/uL (ref 3.80–5.20)
RDW: 13.6 % (ref 11.5–14.5)
WBC: 15.2 10*3/uL — ABNORMAL HIGH (ref 3.6–11.0)

## 2013-11-23 LAB — BASIC METABOLIC PANEL
Anion Gap: 6 — ABNORMAL LOW (ref 7–16)
BUN: 11 mg/dL (ref 7–18)
CALCIUM: 8.6 mg/dL (ref 8.5–10.1)
CHLORIDE: 107 mmol/L (ref 98–107)
CREATININE: 0.82 mg/dL (ref 0.60–1.30)
Co2: 23 mmol/L (ref 21–32)
EGFR (Non-African Amer.): >60
Glucose: 94 mg/dL (ref 65–99)
OSMOLALITY: 271 (ref 275–301)
Potassium: 3.7 mmol/L (ref 3.5–5.1)
Sodium: 136 mmol/L (ref 136–145)

## 2013-11-23 LAB — TROPONIN I
Troponin-I: <0.02 ng/mL
Troponin-I: <0.02 ng/mL

## 2013-11-23 LAB — LIPASE, BLOOD: Lipase: 107 U/L (ref 73–393)

## 2013-12-24 ENCOUNTER — Emergency Department: Payer: Self-pay | Admitting: Emergency Medicine

## 2013-12-24 LAB — CBC WITH DIFFERENTIAL/PLATELET
BASOS ABS: 0.1 10*3/uL (ref 0.0–0.1)
BASOS PCT: 1.1 %
Eosinophil #: 0.4 10*3/uL (ref 0.0–0.7)
Eosinophil %: 3.5 %
HCT: 43.9 % (ref 35.0–47.0)
HGB: 14.8 g/dL (ref 12.0–16.0)
LYMPHS PCT: 36.3 %
Lymphocyte #: 4.1 10*3/uL — ABNORMAL HIGH (ref 1.0–3.6)
MCH: 31.2 pg (ref 26.0–34.0)
MCHC: 33.7 g/dL (ref 32.0–36.0)
MCV: 93 fL (ref 80–100)
MONOS PCT: 4.5 %
Monocyte #: 0.5 x10 3/mm (ref 0.2–0.9)
Neutrophil #: 6.1 10*3/uL (ref 1.4–6.5)
Neutrophil %: 54.6 %
PLATELETS: 215 10*3/uL (ref 150–440)
RBC: 4.74 10*6/uL (ref 3.80–5.20)
RDW: 13.8 % (ref 11.5–14.5)
WBC: 11.2 10*3/uL — ABNORMAL HIGH (ref 3.6–11.0)

## 2013-12-24 LAB — BASIC METABOLIC PANEL
Anion Gap: 8 (ref 7–16)
BUN: 10 mg/dL (ref 7–18)
Calcium, Total: 8.8 mg/dL (ref 8.5–10.1)
Chloride: 108 mmol/L — ABNORMAL HIGH (ref 98–107)
Co2: 23 mmol/L (ref 21–32)
Creatinine: 0.81 mg/dL (ref 0.60–1.30)
EGFR (African American): >60
Glucose: 97 mg/dL (ref 65–99)
Osmolality: 277 (ref 275–301)
Potassium: 3.9 mmol/L (ref 3.5–5.1)
Sodium: 139 mmol/L (ref 136–145)

## 2013-12-24 LAB — TROPONIN I: Troponin-I: <0.02 ng/mL

## 2014-01-06 ENCOUNTER — Ambulatory Visit: Payer: Self-pay

## 2014-03-01 IMAGING — US US CAROTID DUPLEX BILAT
1 series · 14 of 24 positions shown · non-contrast
Comparison: none

REASON FOR EXAM: left eye vision loss
COMMENTS:

[Series 1: us carotid duplex bilat · 0.07mm/px · 14 of 57 slices shown]
[im 1/57]
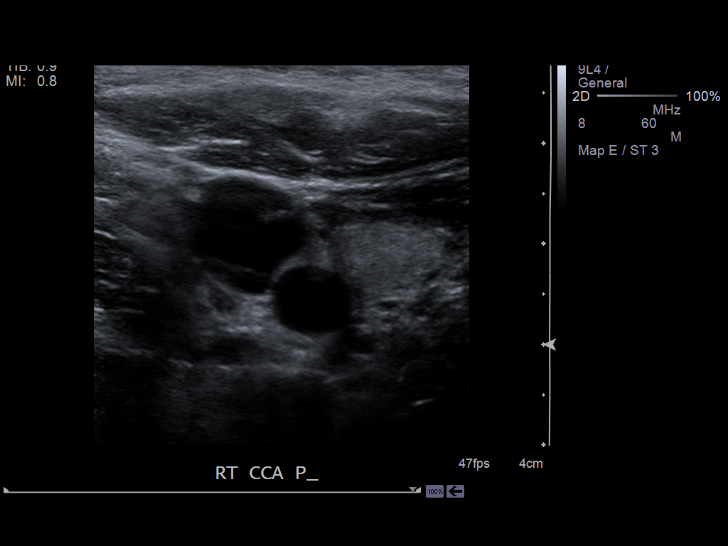
[im 5/57]
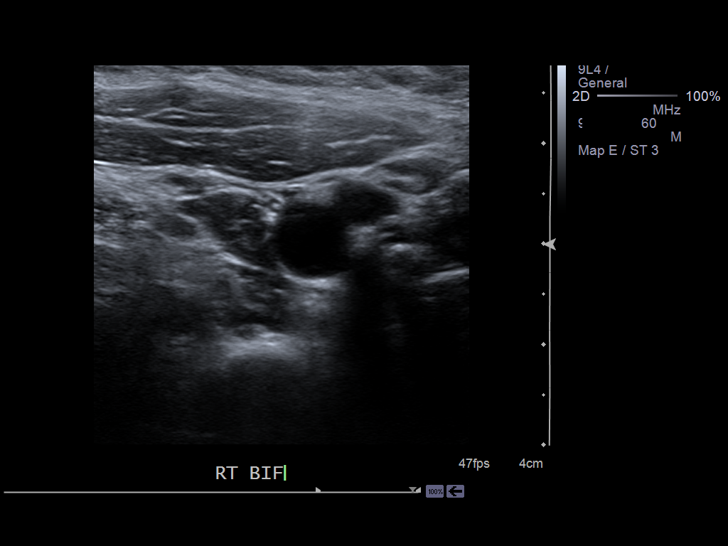
[im 10/57]
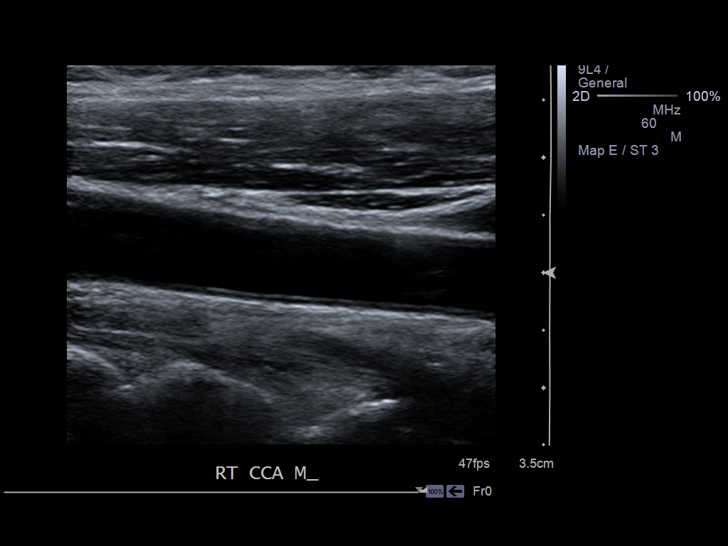
[im 15/57]
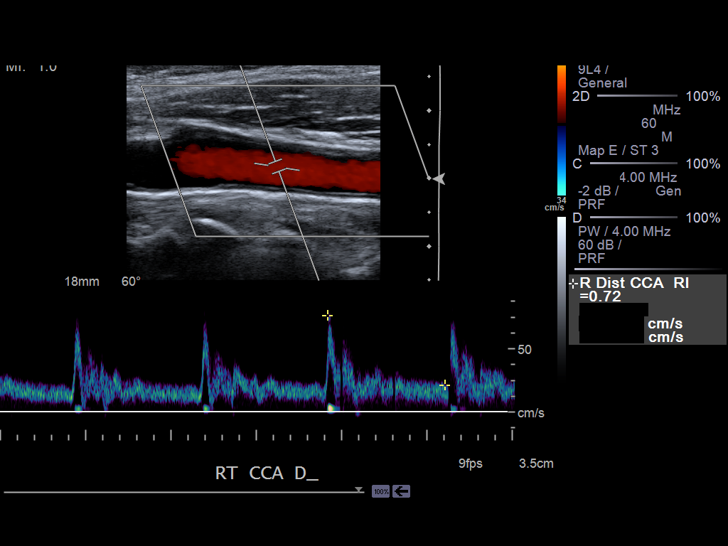
[im 18/57]
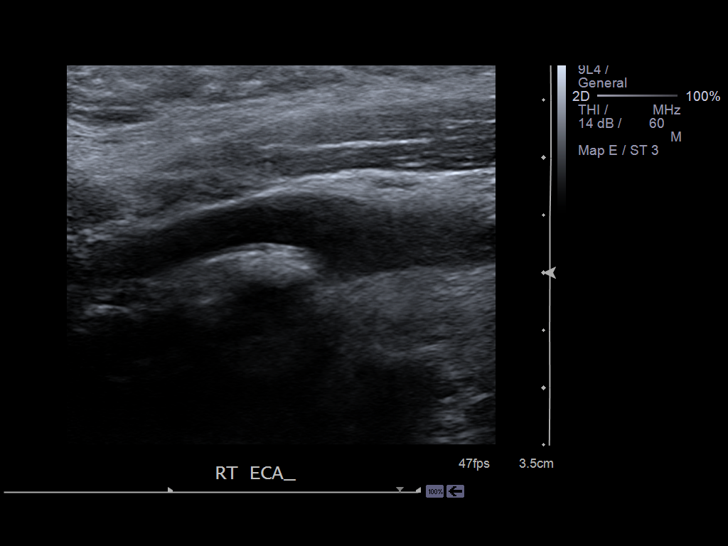
[im 22/57]
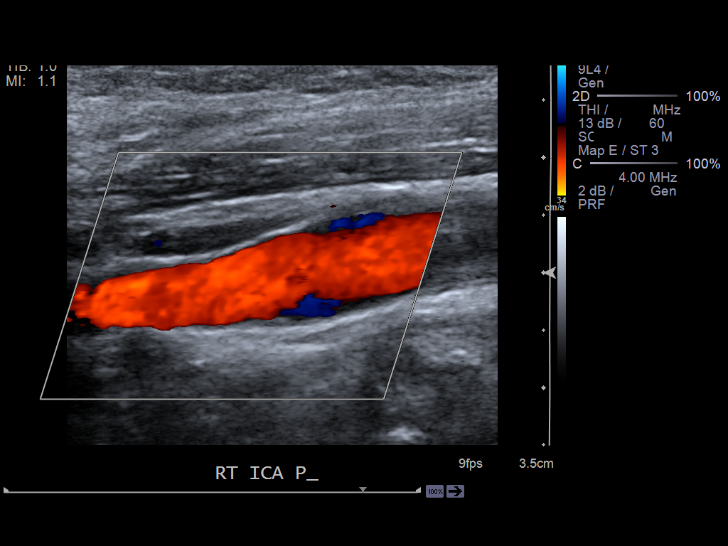
[im 27/57]
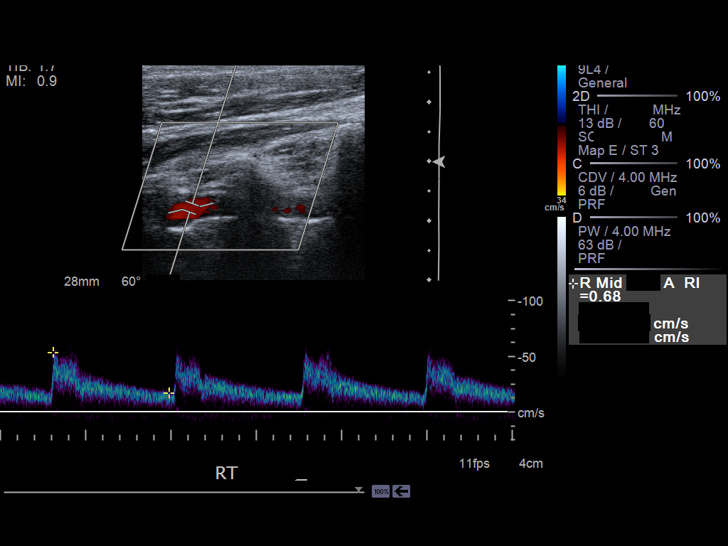
[im 30/57]
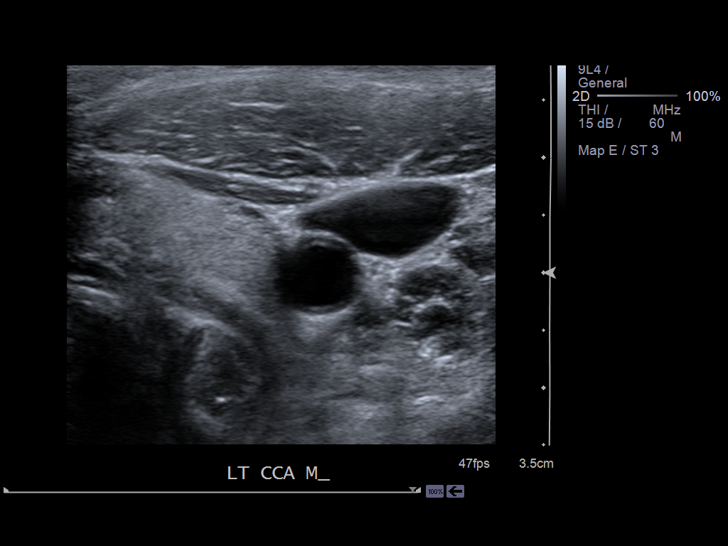
[im 35/57]
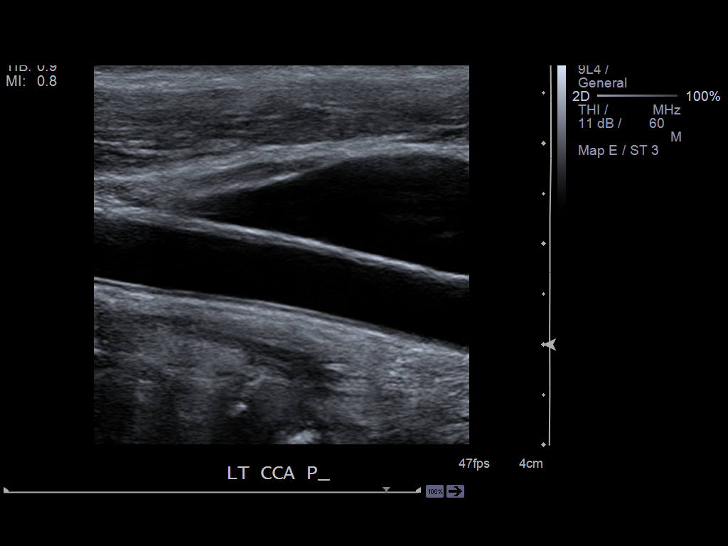
[im 39/57]
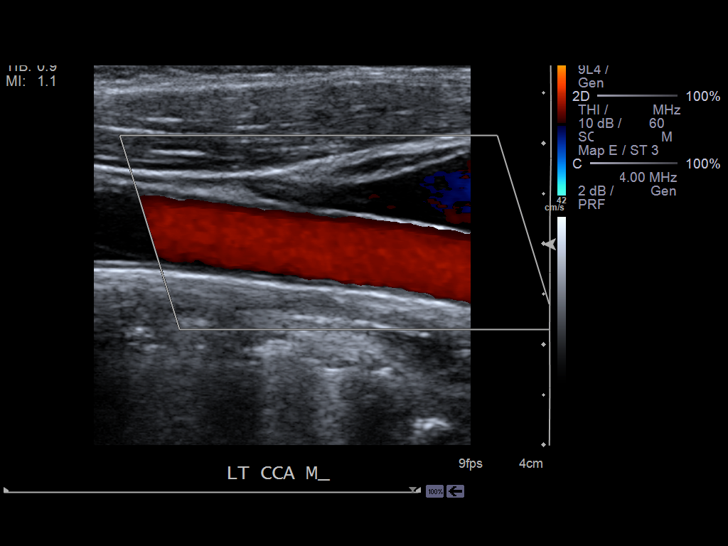
[im 44/57]
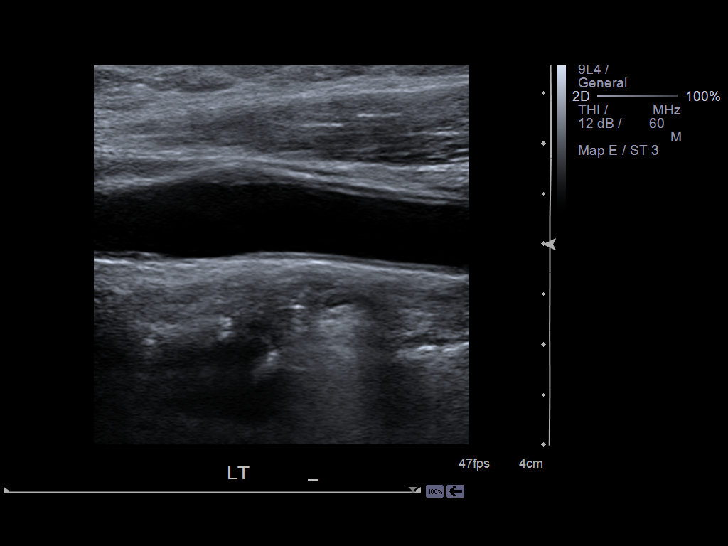
[im 47/57]
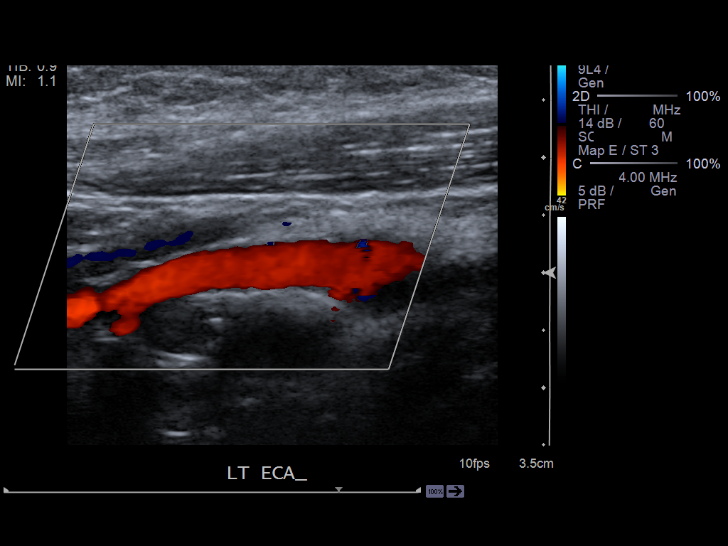
[im 52/57]
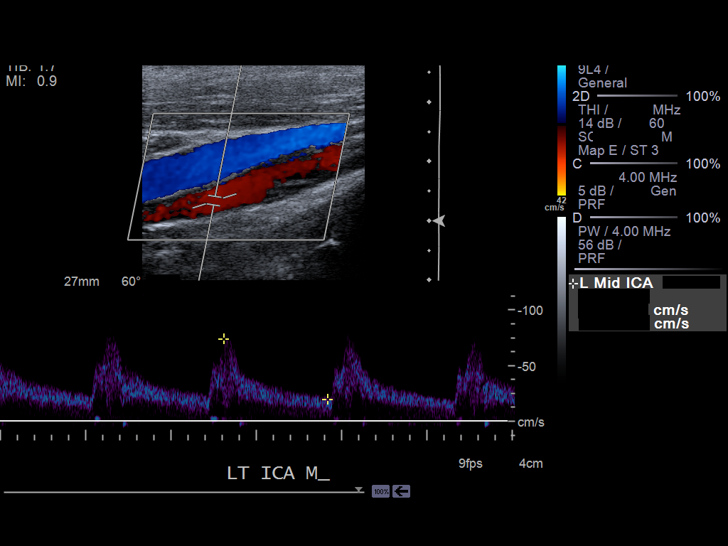
[im 57/57]
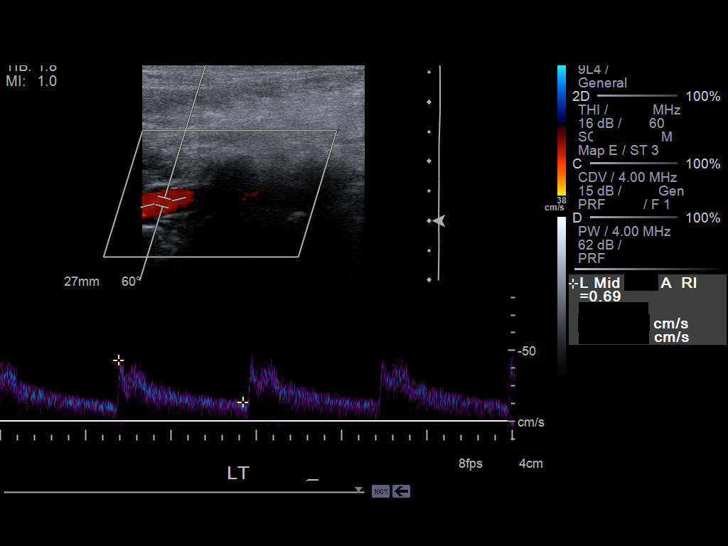

[14 of 24 positions shown; findings below may reference images not displayed]

PROCEDURE:     US  - US CAROTID DOPPLER BILATERAL  - May 30, 2013  [DATE]

RESULT:     Carotid Doppler interrogation demonstrates the presence of
atherosclerotic plaque without calcification in the carotid bulb region on
the right and also on the left. Visually there is no evidence of stenosis.
The color Doppler and spectral Doppler appearance is normal in both carotid
systems. The peak systolic velocities are normal. There is antegrade flow in
both vertebral arteries. There is no vertebral arterial flow reversal. The
internal to common carotid peak systolic velocity ratio is R 0.83 on the
right and 0.84 on the left.
IMPRESSION: 1. No findings to suggest hemodynamically significant stenosis.
Atherosclerotic changes as described.

[REDACTED]

## 2014-03-01 IMAGING — CT CT HEAD WITHOUT CONTRAST
1 series · 16 of 30 positions shown, 20 images · non-contrast
Comparison: none

REASON FOR EXAM: Weakness
COMMENTS:

[Series 2: soft tissue · axial · 0.47mm/px · z∈[-256,-122]mm · 16 of 31 slices shown, 20 images]
[im 2/31  brain]
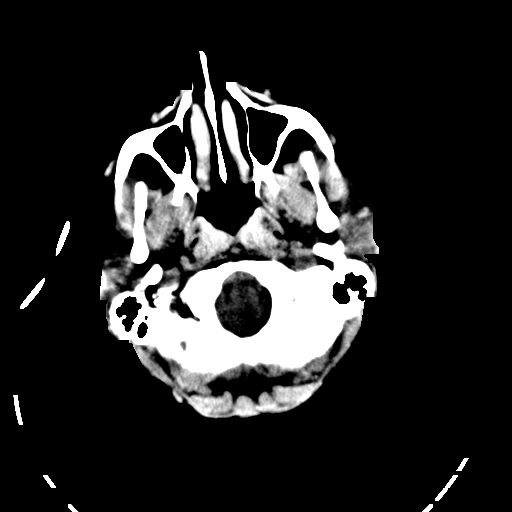
[im 2/31  bone]
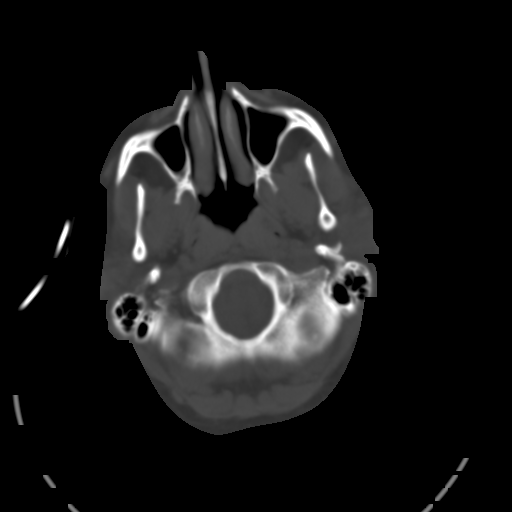
[im 4/31  brain]
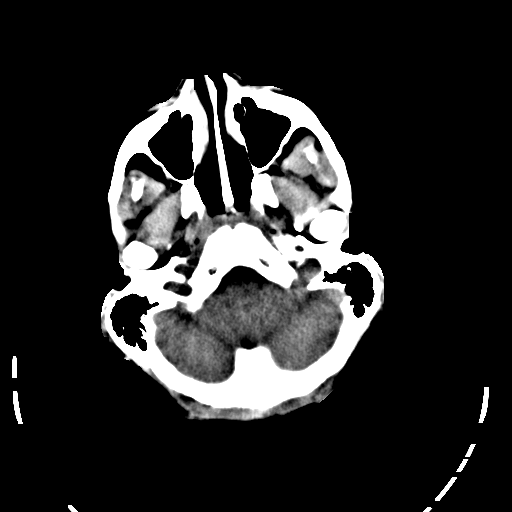
[im 6/31  brain]
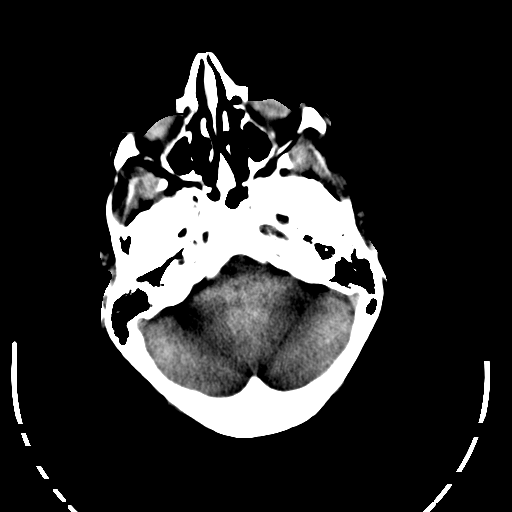
[im 8/31  brain]
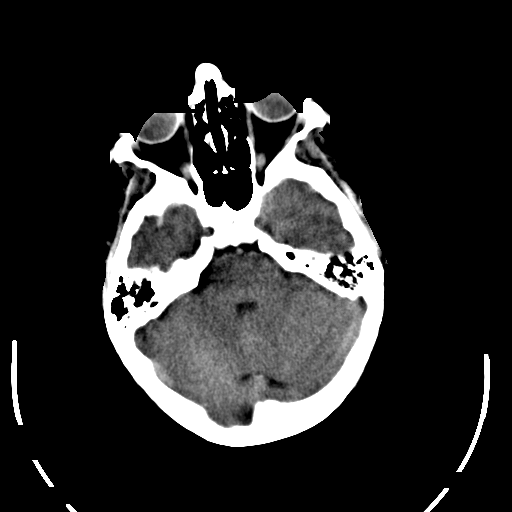
[im 9/31  brain]
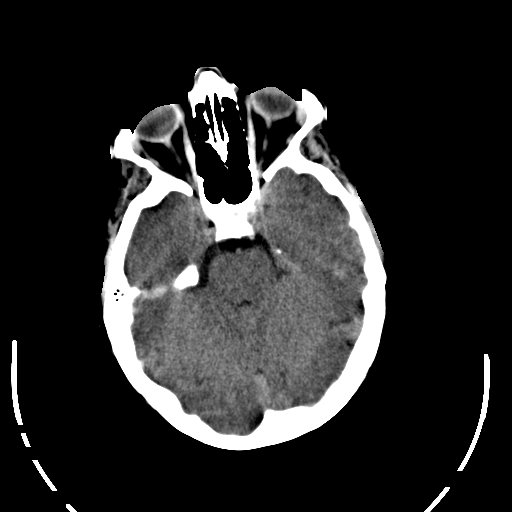
[im 9/31  bone]
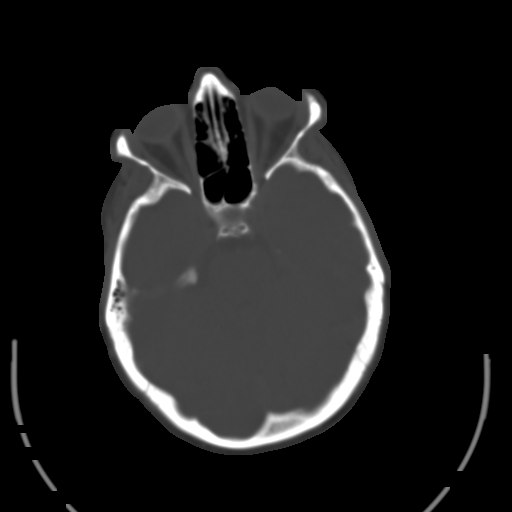
[im 11/31  brain]
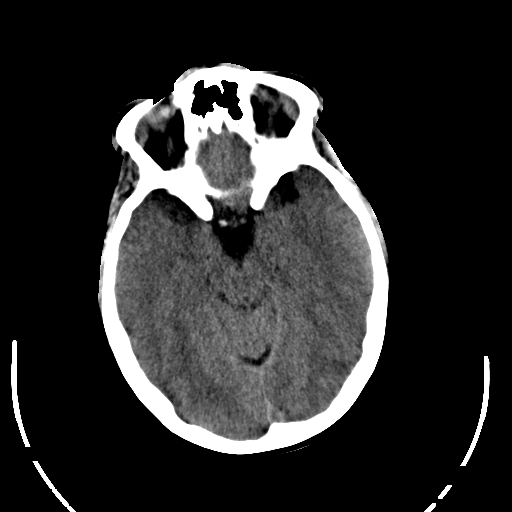
[im 13/31  brain]
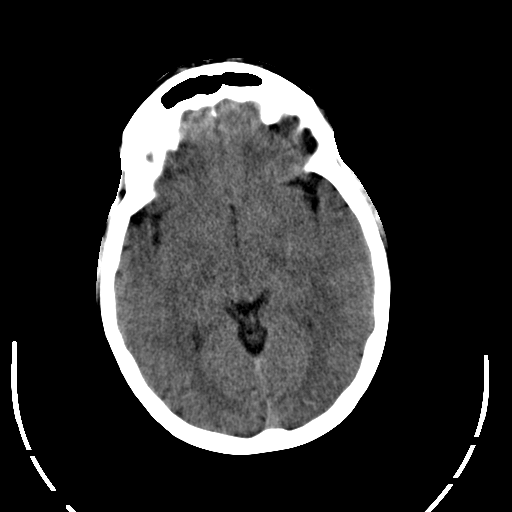
[im 15/31  brain]
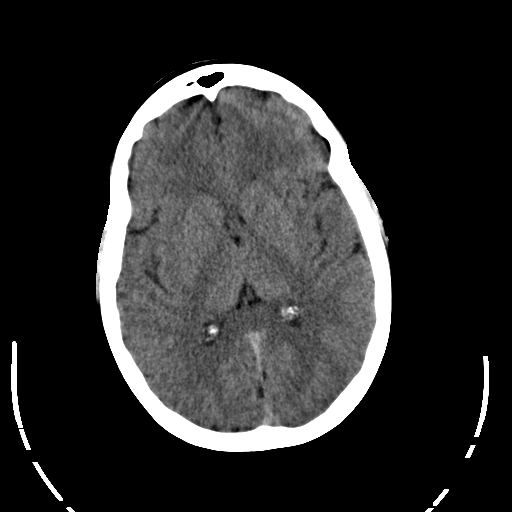
[im 16/31  brain]
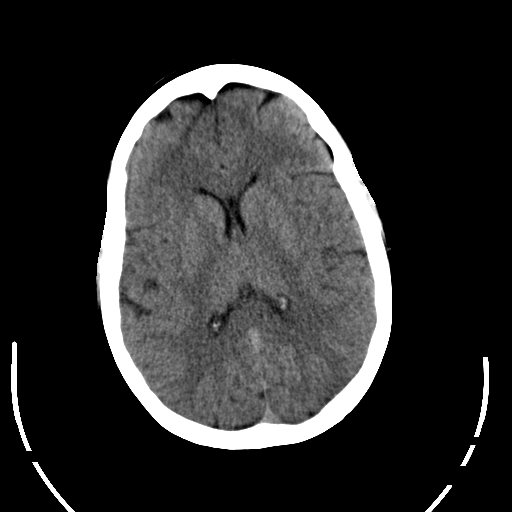
[im 16/31  bone]
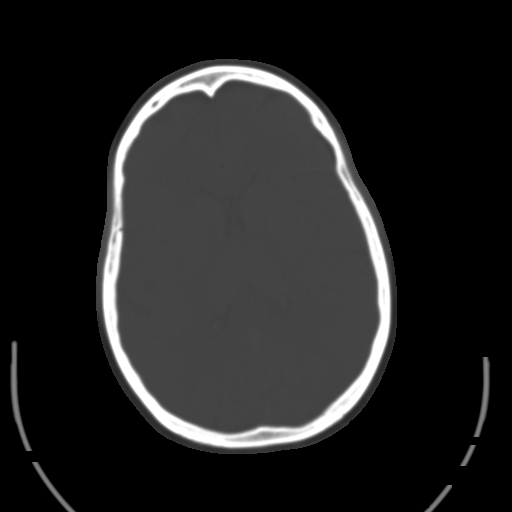
[im 18/31  brain]
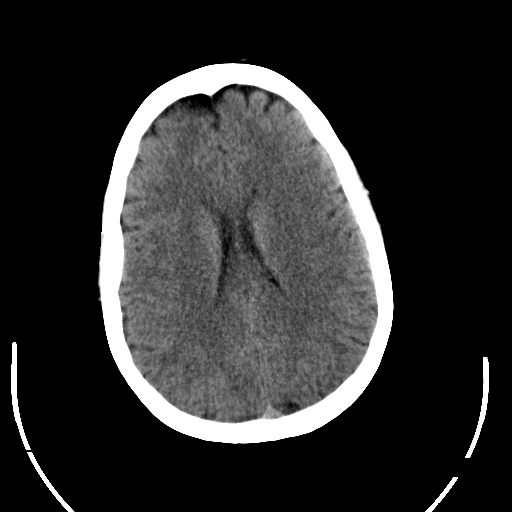
[im 20/31  brain]
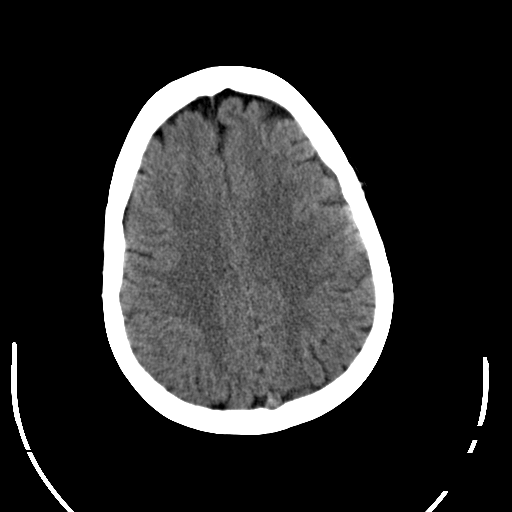
[im 22/31  brain]
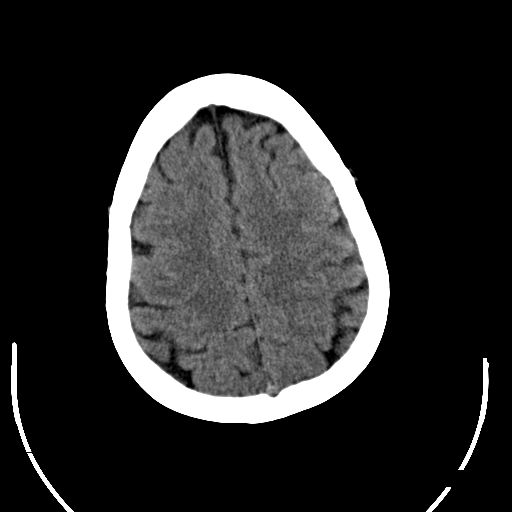
[im 23/31  brain]
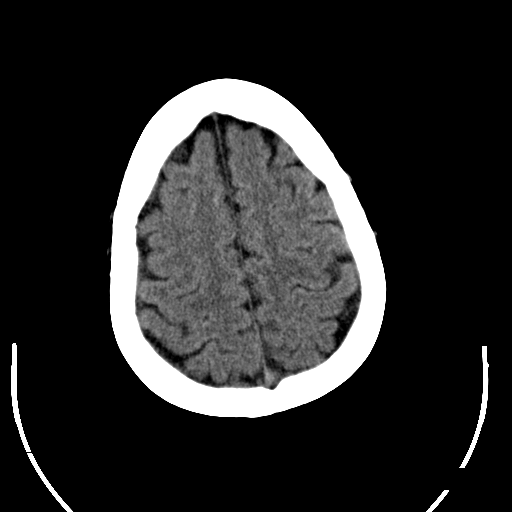
[im 23/31  bone]
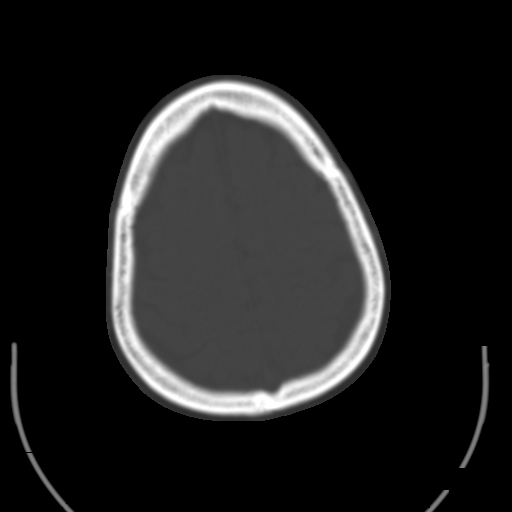
[im 25/31  brain]
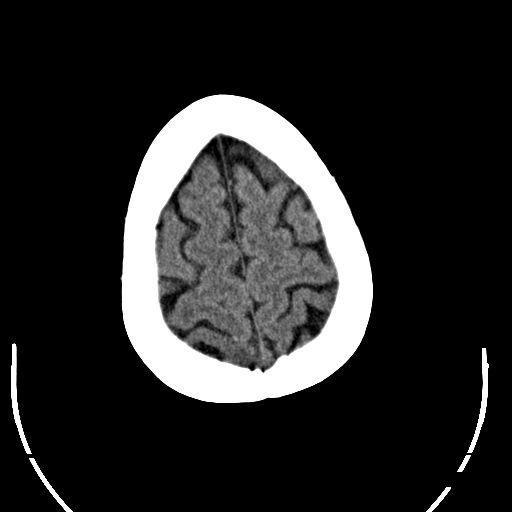
[im 27/31  brain]
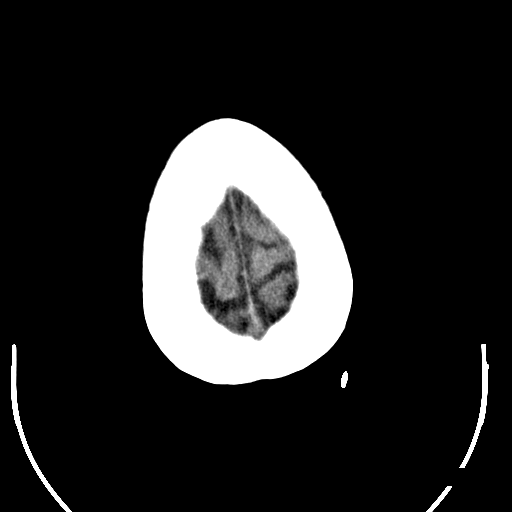
[im 29/31  brain]
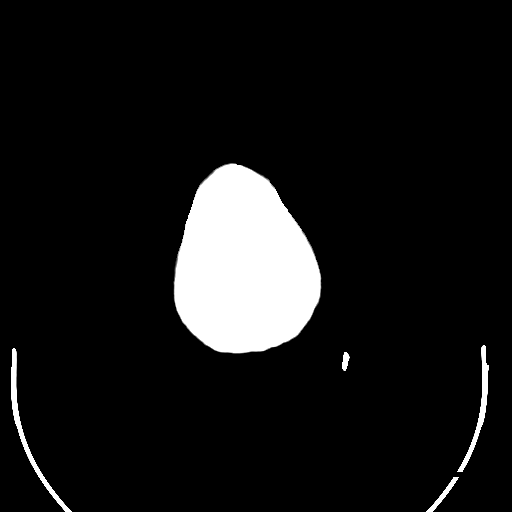

[16 of 30 positions shown; findings below may reference images not displayed]

PROCEDURE:     CT  - CT HEAD WITHOUT CONTRAST  - May 30, 2013  [DATE]

RESULT:     Noncontrast emergent CT of the brain is compared to the previous
exam dated 01/29/2011.

The ventricles and sulci are normal. There is no hemorrhage. There is no
focal mass, mass-effect or midline shift. There is no evidence of edema or
territorial infarct. The bone windows demonstrate normal aeration of the
paranasal sinuses and mastoid air cells. There is no skull fracture
demonstrated.
IMPRESSION: 1. No acute intracranial abnormality.

[REDACTED]

## 2014-04-29 ENCOUNTER — Inpatient Hospital Stay: Payer: Self-pay | Admitting: Psychiatry

## 2014-04-29 LAB — COMPREHENSIVE METABOLIC PANEL
ANION GAP: 10 (ref 7–16)
AST: 12 U/L — AB (ref 15–37)
Albumin: 3.8 g/dL (ref 3.4–5.0)
Alkaline Phosphatase: 66 U/L
BILIRUBIN TOTAL: 0.4 mg/dL (ref 0.2–1.0)
BUN: 13 mg/dL (ref 7–18)
Calcium, Total: 8.9 mg/dL (ref 8.5–10.1)
Chloride: 110 mmol/L — ABNORMAL HIGH (ref 98–107)
Co2: 20 mmol/L — ABNORMAL LOW (ref 21–32)
Creatinine: 0.74 mg/dL (ref 0.60–1.30)
GLUCOSE: 102 mg/dL — AB (ref 65–99)
Osmolality: 280 (ref 275–301)
Potassium: 3.9 mmol/L (ref 3.5–5.1)
SGPT (ALT): 18 U/L (ref 12–78)
Sodium: 140 mmol/L (ref 136–145)
Total Protein: 7.3 g/dL (ref 6.4–8.2)

## 2014-04-29 LAB — CBC
HCT: 41 % (ref 35.0–47.0)
HGB: 14 g/dL (ref 12.0–16.0)
MCH: 31.6 pg (ref 26.0–34.0)
MCHC: 34.1 g/dL (ref 32.0–36.0)
MCV: 93 fL (ref 80–100)
PLATELETS: 171 10*3/uL (ref 150–440)
RBC: 4.42 10*6/uL (ref 3.80–5.20)
RDW: 13 % (ref 11.5–14.5)
WBC: 8.9 10*3/uL (ref 3.6–11.0)

## 2014-04-29 LAB — DRUG SCREEN, URINE
AMPHETAMINES, UR SCREEN: NEGATIVE (ref ?–1000)
Barbiturates, Ur Screen: NEGATIVE (ref ?–200)
Benzodiazepine, Ur Scrn: NEGATIVE (ref ?–200)
CANNABINOID 50 NG, UR ~~LOC~~: NEGATIVE (ref ?–50)
COCAINE METABOLITE, UR ~~LOC~~: NEGATIVE (ref ?–300)
MDMA (Ecstasy)Ur Screen: POSITIVE (ref ?–500)
METHADONE, UR SCREEN: NEGATIVE (ref ?–300)
Opiate, Ur Screen: NEGATIVE (ref ?–300)
Phencyclidine (PCP) Ur S: NEGATIVE (ref ?–25)
Tricyclic, Ur Screen: NEGATIVE (ref ?–1000)

## 2014-04-29 LAB — URINALYSIS, COMPLETE
BILIRUBIN, UR: NEGATIVE
BLOOD: NEGATIVE
Bacteria: NONE SEEN
GLUCOSE, UR: NEGATIVE mg/dL (ref 0–75)
KETONE: NEGATIVE
LEUKOCYTE ESTERASE: NEGATIVE
NITRITE: NEGATIVE
PROTEIN: NEGATIVE
Ph: 5 (ref 4.5–8.0)
RBC,UR: 1 /HPF (ref 0–5)
Specific Gravity: 1.024 (ref 1.003–1.030)
Squamous Epithelial: 1

## 2014-04-29 LAB — ETHANOL
Ethanol %: <0.003 % (ref 0.000–0.080)
Ethanol: <3 mg/dL

## 2014-04-29 LAB — SALICYLATE LEVEL: SALICYLATES, SERUM: 5.5 mg/dL — AB

## 2014-04-29 LAB — ACETAMINOPHEN LEVEL

## 2014-05-01 LAB — LIPID PANEL
CHOLESTEROL: 184 mg/dL (ref 0–200)
HDL: 23 mg/dL — AB (ref 40–60)
Ldl Cholesterol, Calc: 114 mg/dL — ABNORMAL HIGH (ref 0–100)
TRIGLYCERIDES: 235 mg/dL — AB (ref 0–200)
VLDL Cholesterol, Calc: 47 mg/dL — ABNORMAL HIGH (ref 5–40)

## 2014-05-01 LAB — HEMOGLOBIN A1C: HEMOGLOBIN A1C: 5.3 % (ref 4.2–6.3)

## 2014-05-16 LAB — CBC
HCT: 40.4 % (ref 35.0–47.0)
HGB: 13.4 g/dL (ref 12.0–16.0)
MCH: 30.7 pg (ref 26.0–34.0)
MCHC: 33.3 g/dL (ref 32.0–36.0)
MCV: 92 fL (ref 80–100)
Platelet: 237 10*3/uL (ref 150–440)
RBC: 4.38 10*6/uL (ref 3.80–5.20)
RDW: 13.6 % (ref 11.5–14.5)
WBC: 8.9 10*3/uL (ref 3.6–11.0)

## 2014-05-16 LAB — URINALYSIS, COMPLETE
BILIRUBIN, UR: NEGATIVE
BLOOD: NEGATIVE
Bacteria: NONE SEEN
GLUCOSE, UR: NEGATIVE mg/dL (ref 0–75)
Ketone: NEGATIVE
Leukocyte Esterase: NEGATIVE
Nitrite: NEGATIVE
PH: 6 (ref 4.5–8.0)
Protein: NEGATIVE
RBC, UR: NONE SEEN /HPF (ref 0–5)
Specific Gravity: 1.012 (ref 1.003–1.030)
Squamous Epithelial: 2
WBC UR: NONE SEEN /HPF (ref 0–5)

## 2014-05-16 LAB — DRUG SCREEN, URINE
Amphetamines, Ur Screen: NEGATIVE (ref ?–1000)
BARBITURATES, UR SCREEN: NEGATIVE (ref ?–200)
BENZODIAZEPINE, UR SCRN: NEGATIVE (ref ?–200)
CANNABINOID 50 NG, UR ~~LOC~~: POSITIVE (ref ?–50)
COCAINE METABOLITE, UR ~~LOC~~: NEGATIVE (ref ?–300)
MDMA (Ecstasy)Ur Screen: NEGATIVE (ref ?–500)
Methadone, Ur Screen: NEGATIVE (ref ?–300)
OPIATE, UR SCREEN: NEGATIVE (ref ?–300)
PHENCYCLIDINE (PCP) UR S: NEGATIVE (ref ?–25)
TRICYCLIC, UR SCREEN: NEGATIVE (ref ?–1000)

## 2014-05-16 LAB — COMPREHENSIVE METABOLIC PANEL
ALBUMIN: 3.7 g/dL (ref 3.4–5.0)
Alkaline Phosphatase: 150 U/L — ABNORMAL HIGH
Anion Gap: 5 — ABNORMAL LOW (ref 7–16)
BUN: 11 mg/dL (ref 7–18)
Bilirubin,Total: 0.2 mg/dL (ref 0.2–1.0)
CO2: 22 mmol/L (ref 21–32)
CREATININE: 0.86 mg/dL (ref 0.60–1.30)
Calcium, Total: 8.9 mg/dL (ref 8.5–10.1)
Chloride: 111 mmol/L — ABNORMAL HIGH (ref 98–107)
EGFR (African American): >60
Glucose: 92 mg/dL (ref 65–99)
Osmolality: 275 (ref 275–301)
POTASSIUM: 4.1 mmol/L (ref 3.5–5.1)
SGOT(AST): 24 U/L (ref 15–37)
SGPT (ALT): 174 U/L — ABNORMAL HIGH (ref 12–78)
Sodium: 138 mmol/L (ref 136–145)
Total Protein: 7.6 g/dL (ref 6.4–8.2)

## 2014-05-16 LAB — ACETAMINOPHEN LEVEL

## 2014-05-16 LAB — ETHANOL: Ethanol: <3 mg/dL

## 2014-05-16 LAB — CARBAMAZEPINE LEVEL, TOTAL: Carbamazepine: 2.6 ug/mL — ABNORMAL LOW (ref 4.0–12.0)

## 2014-05-16 LAB — SALICYLATE LEVEL

## 2014-05-17 ENCOUNTER — Inpatient Hospital Stay: Payer: Self-pay | Admitting: Psychiatry

## 2014-05-25 LAB — HEMOGLOBIN A1C: HEMOGLOBIN A1C: 5.6 % (ref 4.2–6.3)

## 2014-05-25 LAB — LIPID PANEL
Cholesterol: 205 mg/dL — ABNORMAL HIGH (ref 0–200)
HDL: 31 mg/dL — AB (ref 40–60)
Ldl Cholesterol, Calc: 126 mg/dL — ABNORMAL HIGH (ref 0–100)
TRIGLYCERIDES: 241 mg/dL — AB (ref 0–200)
VLDL CHOLESTEROL, CALC: 48 mg/dL — AB (ref 5–40)

## 2014-05-25 LAB — CARBAMAZEPINE LEVEL, TOTAL: Carbamazepine: 6.8 ug/mL (ref 4.0–12.0)

## 2015-03-10 NOTE — Consult Note (Signed)
Brief Consult Note: Diagnosis: Major depressive disorder rec urrent severe, PTSD.   Patient was seen by consultant.   Consult note dictated.   Recommend further assessment or treatment.   Orders entered.   Comments: Ms. Curtner has a h/o depression and anxiety. She has been off medications since June 2014. She has been under considerable stress lately and relapsed on Cocaine. She wants to be restarted on medications.   PLAN: 1. The patient does not meet criteria for IVC. Please discharge as appropriate.  2. I will restart Seroquel and Clonazepam. Rx given.   3. She will follow up with Phyllis Ginger at [place] and Dr. Kerry Hough, her telepsychiatrist..  Electronic Signatures: Orson Slick (MD)  (Signed 10-Oct-14 15:17)  Authored: Brief Consult Note   Last Updated: 10-Oct-14 15:17 by Orson Slick (MD)

## 2015-03-10 NOTE — Discharge Summary (Signed)
PATIENT NAME:  Monique Greer, Monique Greer MR#:  [id number] DATE OF BIRTH:  Mar 18, 1971  DATE OF ADMISSION:  05/30/2013 DATE OF DISCHARGE:  05/31/2013  PRIMARY CARE PHYSICIAN:  Salome Holmes, MD  DISCHARGE DIAGNOSES:  1.  Transient ischemic attack versus amaurosis fugax.  2.  Hypertension.  3.  Dyslipidemia.  4.  Chronic systolic heart failure, mild.   CONDITION ON DISCHARGE: Stable.   CODE STATUS: FULL CODE.   MEDICATIONS ON DISCHARGE:  1.  Lyrica 50 mg oral capsule 3 times a day. 2.  Seroquel 50 mg oral once a day.  3.  Simvastatin 20 mg once a day.  4.  Aspirin 81 mg once a day.  5.  Lisinopril 5 mg once a day.   HOME OXYGEN: No.   DIET ON DISCHARGE: Low sodium, low fat, low cholesterol.  ACTIVITY LIMITATION: As tolerated.   TIMEFRAME TO FOLLOW-UP:  In 1 to 2 days with Dr. Jennings Books.   HISTORY OF PRESENT ILLNESS: A 44 year old female with history of syncopal episode in the past and 2 episodes on the morning of presentation. It was like a shade or curtain was pulled over on her left eye.  Lasting for a few minutes. She also felt clammy, flushed, headache whole day and left side of her head pain.  Late morning while she was in the bedroom walking around ended up on the ground and around 12 noon she was in the bathroom and she passed out and fell over.  She has a lot of stress in her life. Though she decided to come to the ER.  She was found having bradycardia.  Hospitalist service was contacted for further evaluation.  HOSPITAL COURSE AND STAY: For her possible TIA/amaurosis fugax type of episode, she  was admitted on telemetry observation and stroke work-up was done. MRI of the brain, CT of the head, carotid Doppler and echocardiogram were done. They were not suggestive of any abnormalities, so I discussed the case with Dr. Manuella Ghazi on the phone, and he suggested it might be either seizure also so he would like to see the patient in the clinic as soon as possible, and the patient agreed for  that, so we discharged her home with regular stroke medication.  OTHER MEDICAL ISSUES IN THIS HOSPITAL STAY:  1.  Hypertension. The patient was taking metoprolol, but her heart rate was running in the 40s so we stopped metoprolol and started her on lisinopril. 2.  Bradycardia. The patient was asymptomatic, but we stopped metoprolol and heart rate came up.  2.  Dyslipidemia. Simvastatin was continued.  LABORATORY AND DIAGNOSTICS: In the hospital: Hemoglobin was 14.1. Creatinine was 0.77 and potassium 4.0. LFTs were within normal range. Troponin less than 0.02. Urinalysis was negative. Urine for toxicology was positive for cannabinoid. CT head was no acute intracranial abnormality.  Carotid Doppler:  No significant findings suggestive of hemodynamically significant stenosis.  Triglyceride was 206, HDL was 26 and LDL was 120. Echocardiogram: Left ventricular ejection fraction 45% to 50%, lower left ventricular systolic function normal, mild dilated left and right atrium and mild to moderate mitral valve regurgitation. MRI of the brain:  No acute intracranial pathology.   TOTAL TIME SPENT ON THIS DISCHARGE: 45 minutes.  ____________________________ Ceasar Lund Anselm Jungling, MD [id number] D: 06/03/2013 07:18:18 ET T: 06/03/2013 07:43:07 ET JOB#: [id number]  cc: Ceasar Lund. Anselm Jungling, MD, <Dictator> Salome Holmes, MD Vaughan Basta MD ELECTRONICALLY SIGNED 06/03/2013 7:59
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
## 2015-03-10 NOTE — Op Note (Signed)
PATIENT NAME:  Monique Greer, Monique Greer MR#:  [id number] DATE OF BIRTH:  1971-08-20  DATE OF PROCEDURE:  10/07/2013  PREOPERATIVE DIAGNOSIS: Left volar recurrent ganglion cyst.   POSTOPERATIVE DIAGNOSIS: Left volar recurrent ganglion cyst.   PROCEDURE: Excision left volar cyst.   SURGEON: Hessie Knows, M.D.   ASSISTANT: Rachelle Hora, PA-C  ANESTHESIA: General.   DESCRIPTION OF PROCEDURE: The patient was brought to the operating room and after adequate anesthesia was obtained the left arm was prepped and draped in the usual sterile fashion with a tourniquet applied to the upper arm. After patient identification and timeout procedures were completed, the tourniquet was raised to 250 mmHg. Prior volar incision was utilized and extended slightly proximally for identification of the radial artery and vein. This was traced down distally. There was attachment of the artery and vein to the cyst wall. The cyst was then separated from surrounding tissue and the ulnar side of the wall was removed and sent as specimen. There was a large amount of gelatinous fluid consistent with ganglion cyst. The tract was then followed down to the volar radial scaphoid joint and the opening that created the cyst was ablated with the use of cautery. The wound was irrigated and tourniquet let down to make sure the artery was intact. Artery was intact and after verifying this the tourniquet was raised again to allow for better wound closure. The wound was closed using simple interrupted 4-0 nylon followed by Xeroform, 4 x 4's, and a fiberglass splint. The patient was sent to the recovery room in stable condition.   ESTIMATED BLOOD LOSS: Minimal.   COMPLICATIONS: None.   SPECIMEN: Cyst wall.  TOURNIQUET TIME: 20 minutes at 250 mmHg.  ____________________________ Laurene Footman, MD mjm:sb D: 10/07/2013 13:01:29 ET T: 10/07/2013 13:10:37 ET JOB#: [id number]  cc: Laurene Footman, MD, <Dictator> Laurene Footman MD ELECTRONICALLY  SIGNED 10/07/2013 19:39
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
## 2015-03-11 NOTE — Consult Note (Signed)
Brief Consult Note: Diagnosis: Mood disorder NOS.   Patient was seen by consultant.   Recommend further assessment or treatment.   Orders entered.   Comments: Will admit to psychiatry.  Electronic Signatures: Orson Slick (MD)  (Signed 12-Jun-15 15:47)  Authored: Brief Consult Note   Last Updated: 12-Jun-15 15:47 by Orson Slick (MD)
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
## 2015-03-12 NOTE — Op Note (Signed)
PATIENT NAME:  Monique Greer, Monique Greer MR#:  [id number] DATE OF BIRTH:  [birth year]  DATE OF PROCEDURE:  12/26/2011  PREOPERATIVE DIAGNOSIS: Cervical spondylosis with radiculopathy.   POSTOPERATIVE DIAGNOSIS: Cervical spondylosis with radiculopathy.   PROCEDURES:  1. Anterior cervical diskectomy and fusion with removal of posterior osteophytes.  2. Application of anterior cervical plate, C5-C6.  3. Insertion of interbody device, trabecular metal implant, C5-C6.   SURGEON: Alysia Penna. Mauri Pole, M.D.   ASSISTANT: Reche Dixon, PA-C  ANESTHESIA: General.   ESTIMATED BLOOD LOSS: Negligible.   REPLACEMENT: 1 liter crystalloid.   DRAINS: One Hemovac.   COMPLICATIONS: None.   IMPLANTS USED: 7 mm trabecular metal 11 mm wide implant, CopiOs bone void filler, 12 mm anterior cervical plate, Trinica.   BRIEF CLINICAL NOTE AND PATHOLOGY: The patient had persistent neck pain with radiating pain and documented foraminal encroachment. Options, risks, and benefits were discussed and she elected to proceed with operative intervention. At the time of the procedure, there was marked lateral stenosis. The patient's bone was of good quality.   DESCRIPTION OF PROCEDURE: Adequate general anesthesia, supine position, a roll placed under the neck. Shoulder positioner was used and pressure applied. Lateral fluoroscopy was used and the appropriate level was marked on the skin. Routine prepping and draping.   A routine left side approach was made. The fascia was opened and dissection carried down to the anterior cervical spine. The neurovascular structures were carefully protected and the anterior cervical spine was exposed. Self-retaining retractor was then placed.   Lateral fluoroscopy was used to place the distraction pins in C5 and C6 and distraction was then performed.   The microscope was then brought into the field. The anterior annulus was excised and diskectomy was performed, again using fluoroscopic guidance, to  confirm the appropriate level. The posterior osteophytes were then removed with the 45 degree Kerrison and the posterior longitudinal ligament was removed. The lateral gutters were decompressed. The incision was thoroughly irrigated.   Sizing was performed after the endplates were burred. The 7 mm fit very nicely. The actual 7 mm implant was then placed. It had been packed with CopiOs. A small remaining piece of CopiOs was placed over the anterior aspect of the trabecular metal implant and the plate was applied, being checked on AP and lateral views. The screws were applied in routine fashion, again fluoroscopy was checked. Position was good. The incision was thoroughly irrigated, hemostasis was good. The Hemovac drain was placed. The subcutaneous was closed with 0 Vicryl, skin closed with subcuticular Vicryl and skin glue. A soft sterile dressing was applied. Sponge and needle counts were reported as correct prior to and after wound closure. The patient was awakened and taken to the PAC-U having tolerated the tolerance as well.  ____________________________ Alysia Penna. Mauri Pole, MD jcc:slb D: 12/26/2011 14:02:54 ET T: 12/26/2011 14:15:58 ET JOB#: [id number]  cc: Alysia Penna. Mauri Pole, MD, <Dictator> Alysia Penna Omarie Parcell MD ELECTRONICALLY SIGNED 12/31/2011 19:28

## 2015-04-11 ENCOUNTER — Emergency Department: Payer: Medicaid Other

## 2015-04-11 ENCOUNTER — Emergency Department
Admission: EM | Admit: 2015-04-11 | Discharge: 2015-04-11 | Disposition: A | Discharge status: Home / Self Care | Payer: Medicaid Other | Attending: Emergency Medicine | Admitting: Emergency Medicine

## 2015-04-11 DIAGNOSIS — R197 Diarrhea, unspecified: Secondary | ICD-10-CM | POA: Insufficient documentation

## 2015-04-11 DIAGNOSIS — Z720 Tobacco use: Secondary | ICD-10-CM | POA: Insufficient documentation

## 2015-04-11 DIAGNOSIS — S3991XA Unspecified injury of abdomen, initial encounter: Secondary | ICD-10-CM | POA: Insufficient documentation

## 2015-04-11 DIAGNOSIS — W1789XA Other fall from one level to another, initial encounter: Secondary | ICD-10-CM | POA: Diagnosis not present

## 2015-04-11 DIAGNOSIS — S86911A Strain of unspecified muscle(s) and tendon(s) at lower leg level, right leg, initial encounter: Secondary | ICD-10-CM | POA: Insufficient documentation

## 2015-04-11 DIAGNOSIS — I10 Essential (primary) hypertension: Secondary | ICD-10-CM | POA: Insufficient documentation

## 2015-04-11 DIAGNOSIS — Y9289 Other specified places as the place of occurrence of the external cause: Secondary | ICD-10-CM | POA: Diagnosis not present

## 2015-04-11 DIAGNOSIS — Y998 Other external cause status: Secondary | ICD-10-CM | POA: Insufficient documentation

## 2015-04-11 DIAGNOSIS — R112 Nausea with vomiting, unspecified: Secondary | ICD-10-CM | POA: Diagnosis not present

## 2015-04-11 DIAGNOSIS — Z791 Long term (current) use of non-steroidal anti-inflammatories (NSAID): Secondary | ICD-10-CM | POA: Diagnosis not present

## 2015-04-11 DIAGNOSIS — Z79899 Other long term (current) drug therapy: Secondary | ICD-10-CM | POA: Insufficient documentation

## 2015-04-11 DIAGNOSIS — S93401A Sprain of unspecified ligament of right ankle, initial encounter: Secondary | ICD-10-CM | POA: Insufficient documentation

## 2015-04-11 DIAGNOSIS — R42 Dizziness and giddiness: Secondary | ICD-10-CM | POA: Diagnosis not present

## 2015-04-11 DIAGNOSIS — S93421A Sprain of deltoid ligament of right ankle, initial encounter: Secondary | ICD-10-CM

## 2015-04-11 DIAGNOSIS — S99911A Unspecified injury of right ankle, initial encounter: Secondary | ICD-10-CM | POA: Diagnosis present

## 2015-04-11 DIAGNOSIS — Y9389 Activity, other specified: Secondary | ICD-10-CM | POA: Insufficient documentation

## 2015-04-11 HISTORY — DX: Heart failure, unspecified: I50.9

## 2015-04-11 HISTORY — DX: Renal tubulo-interstitial disease, unspecified: N15.9

## 2015-04-11 LAB — URINALYSIS COMPLETE WITH MICROSCOPIC (ARMC ONLY)
Bacteria, UA: NONE SEEN
Bilirubin Urine: NEGATIVE
Glucose, UA: NEGATIVE mg/dL
Hgb urine dipstick: NEGATIVE
Ketones, ur: NEGATIVE mg/dL
Leukocytes, UA: NEGATIVE
Nitrite: NEGATIVE
PH: 5 (ref 5.0–8.0)
Protein, ur: NEGATIVE mg/dL
Specific Gravity, Urine: 1.012 (ref 1.005–1.030)

## 2015-04-11 MED ORDER — ONDANSETRON 8 MG PO TBDP
8.0000 mg | ORAL_TABLET | Freq: Once | ORAL | Status: AC
  Administered 2015-04-11: 8 mg via ORAL

## 2015-04-11 MED ORDER — TRAMADOL HCL 50 MG PO TABS: 50.0000 mg | ORAL_TABLET | Freq: Four times a day (QID) | ORAL | Status: DC | PRN

## 2015-04-11 MED ORDER — IBUPROFEN 800 MG PO TABS
800.0000 mg | ORAL_TABLET | Freq: Once | ORAL | Status: AC
  Administered 2015-04-11: 800 mg via ORAL

## 2015-04-11 MED ORDER — TRAMADOL HCL 50 MG PO TABS
ORAL_TABLET | ORAL | Status: AC
  Filled 2015-04-11: qty 1

## 2015-04-11 MED ORDER — IBUPROFEN 800 MG PO TABS: 800.0000 mg | ORAL_TABLET | Freq: Three times a day (TID) | ORAL | Status: DC | PRN

## 2015-04-11 MED ORDER — ONDANSETRON 8 MG PO TBDP
ORAL_TABLET | ORAL | Status: AC
  Filled 2015-04-11: qty 1

## 2015-04-11 MED ORDER — TRAMADOL HCL 50 MG PO TABS
50.0000 mg | ORAL_TABLET | Freq: Once | ORAL | Status: AC
  Administered 2015-04-11: 50 mg via ORAL

## 2015-04-11 MED ORDER — IBUPROFEN 800 MG PO TABS
ORAL_TABLET | ORAL | Status: AC
  Filled 2015-04-11: qty 1

## 2015-04-11 NOTE — ED Provider Notes (Signed)
[facility] Emergency Department Provider Note  ____________________________________________  Time seen: Approximately 3:15 PM  I have reviewed the triage vital signs and the nursing notes.   HISTORY  Chief Complaint Fall and Ankle Pain    HPI Monique Greer is a 44 y.o. female she states she got dizzy and fell approximately 3 feet off the porch today. He states she landed on her right foot twisting the ankle. She didn't fell and hyperextended the right knee. Patient states she is dizzy because the last 2-3 days she's had vomiting and diarrhea. Believes she might be dehydrated. Patient is rating her pain as a 7/10. Patient stated movement of the ankle increases her pain. Patient states flexion of the knee increases her pain.   Past Medical History  Diagnosis Date  . Anxiety   . Mitral valve prolapse   . Hypertension   . CHF (congestive heart failure)   . Kidney infection     There are no active problems to display for this patient.   History reviewed. No pertinent past surgical history.  Current Outpatient Rx  Name  Route  Sig  Dispense  Refill  . acetaminophen (TYLENOL) 325 MG tablet   Oral   Take 650 mg by mouth every 6 (six) hours as needed for pain.         Marland Kitchen ibuprofen (ADVIL,MOTRIN) 800 MG tablet   Oral   Take 1 tablet (800 mg total) by mouth every 8 (eight) hours as needed for moderate pain.   15 tablet   0   . metoCLOPramide (REGLAN) 10 MG tablet   Oral   Take 1 tablet (10 mg total) by mouth every 6 (six) hours as needed (nausea/headache).   6 tablet   0   . naproxen sodium (ANAPROX) 220 MG tablet   Oral   Take 220 mg by mouth 2 (two) times daily as needed (for pain).         Marland Kitchen oxyCODONE-acetaminophen (PERCOCET) 5-325 MG per tablet   Oral   Take 2 tablets by mouth every 4 (four) hours as needed for pain.   10 tablet   0   . traMADol (ULTRAM) 50 MG tablet   Oral   Take 1 tablet (50 mg total) by mouth every 6 (six)  hours as needed for pain.   15 tablet   0   . traMADol (ULTRAM) 50 MG tablet   Oral   Take 1 tablet (50 mg total) by mouth every 6 (six) hours as needed for moderate pain.   12 tablet   0     Allergies Sulfa antibiotics  No family history on file.  Social History History  Substance Use Topics  . Smoking status: Current Every Day Smoker -- 1.50 packs/day    Types: Cigarettes  . Smokeless tobacco: Never Used  . Alcohol Use: No    Review of Systems Constitutional: No fever/chills Eyes: No visual changes. ENT: No sore throat. Cardiovascular: Denies chest pain. Respiratory: Denies shortness of breath. Gastrointestinal: Mild right lower quadrant abdominal pain.. States nausea vomiting diarrhea.  No constipation. Genitourinary: Negative for dysuria. Musculoskeletal: Positive for right knee and right ankle pain. Skin: Negative for rash. Neurological: Negative for headaches, focal weakness or numbness.  10-point ROS otherwise negative.  ____________________________________________   PHYSICAL EXAM:  VITAL SIGNS: ED Triage Vitals  Enc Vitals Group     BP 04/11/15 1437 121/64 mmHg     Pulse Rate 04/11/15 1437 81     Resp  04/11/15 1437 18     Temp 04/11/15 1437 98.3 F (36.8 C)     Temp Source 04/11/15 1437 Oral     SpO2 04/11/15 1437 100 %     Weight 04/11/15 1437 200 lb (90.719 kg)     Height 04/11/15 1437 5\' 3"  (1.6 m)     Head Cir --      Peak Flow --      Pain Score 04/11/15 1438 7     Pain Loc --      Pain Edu? --      Excl. in [place]? --     Constitutional: Alert and oriented. Well appearing and in no acute distress. Eyes: Conjunctivae are normal. PERRL. EOMI. Head: Atraumatic. Nose: No congestion/rhinnorhea. Mouth/Throat: Mucous membranes are moist.  Oropharynx non-erythematous. Neck: No stridor.  Cervical spine is nontender palpation full nuchal range of motion. Hematological/Lymphatic/Immunilogical: No cervical lymphadenopathy. Cardiovascular: Normal  rate, regular rhythm. Grossly normal heart sounds.  Good peripheral circulation. Patient is not tachycardic See orthostatic readings. Respiratory: Normal respiratory effort.  No retractions. Lungs CTAB. Gastrointestinal: Soft and nontender. No distention. No abdominal bruits. No CVA tenderness. Musculoskeletal: No obvious deformity or edema to the right knee. It is tender palpation. There is also no obvious deformity to the right ankle. Neurovascular intact decreased range of motion with  inversions.  No joint effusions. Neurologic:  Normal speech and language. No gross focal neurologic deficits are appreciated. Speech is normal. No gait instability. Skin:  Skin is warm, dry and intact. No rash noted. Psychiatric: Mood and affect are normal. Speech and behavior are normal.  ____________________________________________   LABS (all labs ordered are listed, but only abnormal results are displayed)  Labs Reviewed  URINALYSIS COMPLETEWITH MICROSCOPIC ([place])  - Abnormal; Notable for the following:    Color, Urine YELLOW (*)    APPearance HAZY (*)    Squamous Epithelial / LPF 6-30 (*)    All other components within normal limits   ____________________________________________  EKG  ____________________________________________  RADIOLOGY  No acute finding for knee and ankle ____________________________________________   PROCEDURES  Procedure(s) performed: None  Critical Care performed: No  ____________________________________________   INITIAL IMPRESSION / ASSESSMENT AND PLAN / ED COURSE  Pertinent labs & imaging results that were available during my care of the patient were reviewed by me and considered in my medical decision making (see chart for details).  Strain right knee and sprain right ankle ____________________________________________   FINAL CLINICAL IMPRESSION(S) / ED DIAGNOSES  Final diagnoses:  Strain of knee and leg, right, initial encounter  Sprain,  ankle joint, medial, right, initial encounter      Sable Feil, PA-C 04/11/15 1658  Hinda Kehr, MD 04/11/15 [phone]

## 2015-04-11 NOTE — Discharge Instructions (Signed)
Ankle Sprain  An ankle sprain is an injury to the strong, fibrous tissues (ligaments) that hold your ankle bones together.   HOME CARE   · Put ice on your ankle for 1-2 days or as told by your doctor.  ¨ Put ice in a plastic bag.  ¨ Place a towel between your skin and the bag.  ¨ Leave the ice on for 15-20 minutes at a time, every 2 hours while you are awake.  · Only take medicine as told by your doctor.  · Raise (elevate) your injured ankle above the level of your heart as much as possible for 2-3 days.  · Use crutches if your doctor tells you to. Slowly put your own weight on the affected ankle. Use the crutches until you can walk without pain.  · If you have a plaster splint:  ¨ Do not rest it on anything harder than a pillow for 24 hours.  ¨ Do not put weight on it.  ¨ Do not get it wet.  ¨ Take it off to shower or bathe.  · If given, use an elastic wrap or support stocking for support. Take the wrap off if your toes lose feeling (numb), tingle, or turn cold or blue.  · If you have an air splint:  ¨ Add or let out air to make it comfortable.  ¨ Take it off at night and to shower and bathe.  ¨ Wiggle your toes and move your ankle up and down often while you are wearing it.  GET HELP IF:  · You have rapidly increasing bruising or puffiness (swelling).  · Your toes feel very cold.  · You lose feeling in your foot.  · Your medicine does not help your pain.  GET HELP RIGHT AWAY IF:   · Your toes lose feeling (numb) or turn blue.  · You have severe pain that is increasing.  MAKE SURE YOU:   · Understand these instructions.  · Will watch your condition.  · Will get help right away if you are not doing well or get worse.  Document Released: 04/22/2008 Document Revised: 03/21/2014 Document Reviewed: 05/18/2012  [employer]® Patient Information ©2015 [employer]. This information is not intended to replace advice given to you by your health care provider. Make sure you discuss any questions you have with your health care  provider.

## 2015-04-11 NOTE — ED Notes (Signed)
Pt got dizzy and fell approx 3 ft off of porch. States she landed on right foot, pain to right ankle. No LOC. Pt alert and oriented X4, active, cooperative, pt in NAD. RR even and unlabored, color WNL.

## 2015-04-27 ENCOUNTER — Encounter: Payer: Self-pay | Admitting: Emergency Medicine

## 2015-04-27 ENCOUNTER — Emergency Department: Payer: Medicaid Other

## 2015-04-27 ENCOUNTER — Emergency Department
Admission: EM | Admit: 2015-04-27 | Discharge: 2015-04-28 | Disposition: A | Discharge status: Home / Self Care | Payer: Medicaid Other | Attending: Emergency Medicine | Admitting: Emergency Medicine

## 2015-04-27 DIAGNOSIS — Z720 Tobacco use: Secondary | ICD-10-CM | POA: Insufficient documentation

## 2015-04-27 DIAGNOSIS — M549 Dorsalgia, unspecified: Secondary | ICD-10-CM | POA: Diagnosis not present

## 2015-04-27 DIAGNOSIS — I10 Essential (primary) hypertension: Secondary | ICD-10-CM | POA: Insufficient documentation

## 2015-04-27 DIAGNOSIS — Z79899 Other long term (current) drug therapy: Secondary | ICD-10-CM | POA: Insufficient documentation

## 2015-04-27 DIAGNOSIS — W1839XD Other fall on same level, subsequent encounter: Secondary | ICD-10-CM | POA: Diagnosis not present

## 2015-04-27 DIAGNOSIS — M25561 Pain in right knee: Secondary | ICD-10-CM | POA: Diagnosis present

## 2015-04-27 DIAGNOSIS — S86911D Strain of unspecified muscle(s) and tendon(s) at lower leg level, right leg, subsequent encounter: Secondary | ICD-10-CM | POA: Insufficient documentation

## 2015-04-27 DIAGNOSIS — G8929 Other chronic pain: Secondary | ICD-10-CM | POA: Diagnosis not present

## 2015-04-27 MED ORDER — IBUPROFEN 800 MG PO TABS
800.0000 mg | ORAL_TABLET | Freq: Once | ORAL | Status: AC
  Administered 2015-04-28: 800 mg via ORAL

## 2015-04-27 MED ORDER — HYDROCODONE-ACETAMINOPHEN 5-325 MG PO TABS
1.0000 | ORAL_TABLET | Freq: Once | ORAL | Status: AC
  Administered 2015-04-28: 1 via ORAL

## 2015-04-27 MED ORDER — IBUPROFEN 800 MG PO TABS: 800.0000 mg | ORAL_TABLET | Freq: Three times a day (TID) | ORAL | Status: DC | PRN

## 2015-04-27 MED ORDER — HYDROCODONE-ACETAMINOPHEN 5-325 MG PO TABS: 1.0000 | ORAL_TABLET | Freq: Four times a day (QID) | ORAL | Status: DC | PRN
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
## 2015-04-27 NOTE — ED Notes (Signed)
MD at bedside. 
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
## 2015-04-27 NOTE — Discharge Instructions (Signed)
1. Wear knee immobilizer and use crutches as needed. 2. Take pain medicines as needed (Motrin/Norco #15). 3. Elevate affected area several times a day and apply ice. 4. Return to the ER for worsening symptoms, increased swelling/pain, weakness or other concerns.  Knee Pain The knee is the complex joint between your thigh and your lower leg. It is made up of bones, tendons, ligaments, and cartilage. The bones that make up the knee are:  The femur in the thigh.  The tibia and fibula in the lower leg.  The patella or kneecap riding in the groove on the lower femur. CAUSES  Knee pain is a common complaint with many causes. A few of these causes are:  Injury, such as:  A ruptured ligament or tendon injury.  Torn cartilage.  Medical conditions, such as:  Gout  Arthritis  Infections  Overuse, over training, or overdoing a physical activity. Knee pain can be minor or severe. Knee pain can accompany debilitating injury. Minor knee problems often respond well to self-care measures or get well on their own. More serious injuries may need medical intervention or even surgery. SYMPTOMS The knee is complex. Symptoms of knee problems can vary widely. Some of the problems are:  Pain with movement and weight bearing.  Swelling and tenderness.  Buckling of the knee.  Inability to straighten or extend your knee.  Your knee locks and you cannot straighten it.  Warmth and redness with pain and fever.  Deformity or dislocation of the kneecap. DIAGNOSIS  Determining what is wrong may be very straight forward such as when there is an injury. It can also be challenging because of the complexity of the knee. Tests to make a diagnosis may include:  Your caregiver taking a history and doing a physical exam.  Routine X-rays can be used to rule out other problems. X-rays will not reveal a cartilage tear. Some injuries of the knee can be diagnosed by:  Arthroscopy a surgical technique by  which a small video camera is inserted through tiny incisions on the sides of the knee. This procedure is used to examine and repair internal knee joint problems. Tiny instruments can be used during arthroscopy to repair the torn knee cartilage (meniscus).  Arthrography is a radiology technique. A contrast liquid is directly injected into the knee joint. Internal structures of the knee joint then become visible on X-ray film.  An MRI scan is a non X-ray radiology procedure in which magnetic fields and a computer produce two- or three-dimensional images of the inside of the knee. Cartilage tears are often visible using an MRI scanner. MRI scans have largely replaced arthrography in diagnosing cartilage tears of the knee.  Blood work.  Examination of the fluid that helps to lubricate the knee joint (synovial fluid). This is done by taking a sample out using a needle and a syringe. TREATMENT The treatment of knee problems depends on the cause. Some of these treatments are:  Depending on the injury, proper casting, splinting, surgery, or physical therapy care will be needed.  Give yourself adequate recovery time. Do not overuse your joints. If you begin to get sore during workout routines, back off. Slow down or do fewer repetitions.  For repetitive activities such as cycling or running, maintain your strength and nutrition.  Alternate muscle groups. For example, if you are a weight lifter, work the upper body on one day and the lower body the next.  Either tight or weak muscles do not give the proper  support for your knee. Tight or weak muscles do not absorb the stress placed on the knee joint. Keep the muscles surrounding the knee strong.  Take care of mechanical problems.  If you have flat feet, orthotics or special shoes may help. See your caregiver if you need help.  Arch supports, sometimes with wedges on the inner or outer aspect of the heel, can help. These can shift pressure away from  the side of the knee most bothered by osteoarthritis.  A brace called an "unloader" brace also may be used to help ease the pressure on the most arthritic side of the knee.  If your caregiver has prescribed crutches, braces, wraps or ice, use as directed. The acronym for this is PRICE. This means protection, rest, ice, compression, and elevation.  Nonsteroidal anti-inflammatory drugs (NSAIDs), can help relieve pain. But if taken immediately after an injury, they may actually increase swelling. Take NSAIDs with food in your stomach. Stop them if you develop stomach problems. Do not take these if you have a history of ulcers, stomach pain, or bleeding from the bowel. Do not take without your caregiver's approval if you have problems with fluid retention, heart failure, or kidney problems.  For ongoing knee problems, physical therapy may be helpful.  Glucosamine and chondroitin are over-the-counter dietary supplements. Both may help relieve the pain of osteoarthritis in the knee. These medicines are different from the usual anti-inflammatory drugs. Glucosamine may decrease the rate of cartilage destruction.  Injections of a corticosteroid drug into your knee joint may help reduce the symptoms of an arthritis flare-up. They may provide pain relief that lasts a few months. You may have to wait a few months between injections. The injections do have a small increased risk of infection, water retention, and elevated blood sugar levels.  Hyaluronic acid injected into damaged joints may ease pain and provide lubrication. These injections may work by reducing inflammation. A series of shots may give relief for as long as 6 months.  Topical painkillers. Applying certain ointments to your skin may help relieve the pain and stiffness of osteoarthritis. Ask your pharmacist for suggestions. Many over the-counter products are approved for temporary relief of arthritis pain.  In some countries, doctors often  prescribe topical NSAIDs for relief of chronic conditions such as arthritis and tendinitis. A review of treatment with NSAID creams found that they worked as well as oral medications but without the serious side effects. PREVENTION  Maintain a healthy weight. Extra pounds put more strain on your joints.  Get strong, stay limber. Weak muscles are a common cause of knee injuries. Stretching is important. Include flexibility exercises in your workouts.  Be smart about exercise. If you have osteoarthritis, chronic knee pain or recurring injuries, you may need to change the way you exercise. This does not mean you have to stop being active. If your knees ache after jogging or playing basketball, consider switching to swimming, water aerobics, or other low-impact activities, at least for a few days a week. Sometimes limiting high-impact activities will provide relief.  Make sure your shoes fit well. Choose footwear that is right for your sport.  Protect your knees. Use the proper gear for knee-sensitive activities. Use kneepads when playing volleyball or laying carpet. Buckle your seat belt every time you drive. Most shattered kneecaps occur in car accidents.  Rest when you are tired. SEEK MEDICAL CARE IF:  You have knee pain that is continual and does not seem to be getting better.  SEEK IMMEDIATE MEDICAL CARE IF:  Your knee joint feels hot to the touch and you have a high fever. MAKE SURE YOU:   Understand these instructions.  Will watch your condition.  Will get help right away if you are not doing well or get worse. Document Released: 09/01/2007 Document Revised: 01/27/2012 Document Reviewed: 09/01/2007 [facility] Patient Information 2015 [place], [place]. This information is not intended to replace advice given to you by your health care provider. Make sure you discuss any questions you have with your health care provider.  Knee Sprain A knee sprain is a tear in one of the strong, fibrous  tissues that connect the bones (ligaments) in your knee. The severity of the sprain depends on how much of the ligament is torn. The tear can be either partial or complete. CAUSES  Often, sprains are a result of a fall or injury. The force of the impact causes the fibers of your ligament to stretch too much. This excess tension causes the fibers of your ligament to tear. SIGNS AND SYMPTOMS  You may have some loss of motion in your knee. Other symptoms include:  Bruising.  Pain in the knee area.  Tenderness of the knee to the touch.  Swelling. DIAGNOSIS  To diagnose a knee sprain, your health care provider will physically examine your knee. Your health care provider may also suggest an X-ray exam of your knee to make sure no bones are broken. TREATMENT  If your ligament is only partially torn, treatment usually involves keeping the knee in a fixed position (immobilization) or bracing your knee for activities that require movement for several weeks. To do this, your health care provider will apply a bandage, cast, or splint to keep your knee from moving and to support your knee during movement until it heals. For a partially torn ligament, the healing process usually takes 4-6 weeks. If your ligament is completely torn, depending on which ligament it is, you may need surgery to reconnect the ligament to the bone or reconstruct it. After surgery, a cast or splint may be applied and will need to stay on your knee for 4-6 weeks while your ligament heals. HOME CARE INSTRUCTIONS  Keep your injured knee elevated to decrease swelling.  To ease pain and swelling, apply ice to the injured area:  Put ice in a plastic bag.  Place a towel between your skin and the bag.  Leave the ice on for 20 minutes, 2-3 times a day.  Only take medicine for pain as directed by your health care provider.  Do not leave your knee unprotected until pain and stiffness go away (usually 4-6 weeks).  If you have a cast  or splint, do not allow it to get wet. If you have been instructed not to remove it, cover it with a plastic bag when you shower or bathe. Do not swim.  Your health care provider may suggest exercises for you to do during your recovery to prevent or limit permanent weakness and stiffness. SEEK IMMEDIATE MEDICAL CARE IF:  Your cast or splint becomes damaged.  Your pain becomes worse.  You have significant pain, swelling, or numbness below the cast or splint. MAKE SURE YOU:  Understand these instructions.  Will watch your condition.  Will get help right away if you are not doing well or get worse. Document Released: 11/04/2005 Document Revised: 08/25/2013 Document Reviewed: 06/16/2013 [facility] Patient Information 2015 [place], [place]. This information is not intended to replace advice given to you by  your health care provider. Make sure you discuss any questions you have with your health care provider. ° °

## 2015-04-28 MED ORDER — IBUPROFEN 800 MG PO TABS
ORAL_TABLET | ORAL | Status: AC
  Administered 2015-04-28: 800 mg via ORAL
  Filled 2015-04-28: qty 1

## 2015-04-28 MED ORDER — HYDROCODONE-ACETAMINOPHEN 5-325 MG PO TABS
ORAL_TABLET | ORAL | Status: AC
  Administered 2015-04-28: 1 via ORAL
  Filled 2015-04-28: qty 1

## 2015-05-31 ENCOUNTER — Other Ambulatory Visit: Payer: Self-pay | Admitting: Orthopedic Surgery

## 2015-05-31 DIAGNOSIS — M545 Low back pain: Secondary | ICD-10-CM

## 2015-06-06 ENCOUNTER — Ambulatory Visit
Admission: RE | Admit: 2015-06-06 | Discharge: 2015-06-06 | Disposition: A | Discharge status: Home / Self Care | Payer: Medicaid Other | Source: Ambulatory Visit | Attending: Orthopedic Surgery | Admitting: Orthopedic Surgery

## 2015-06-06 DIAGNOSIS — M545 Low back pain: Secondary | ICD-10-CM | POA: Diagnosis present

## 2015-07-24 ENCOUNTER — Emergency Department: Payer: Medicaid Other

## 2015-07-24 ENCOUNTER — Emergency Department
Admission: EM | Admit: 2015-07-24 | Discharge: 2015-07-24 | Disposition: A | Discharge status: Home / Self Care | Payer: Medicaid Other | Attending: Emergency Medicine | Admitting: Emergency Medicine

## 2015-07-24 ENCOUNTER — Encounter: Payer: Self-pay | Admitting: *Deleted

## 2015-07-24 DIAGNOSIS — R079 Chest pain, unspecified: Secondary | ICD-10-CM | POA: Diagnosis not present

## 2015-07-24 DIAGNOSIS — R52 Pain, unspecified: Secondary | ICD-10-CM

## 2015-07-24 DIAGNOSIS — K858 Other acute pancreatitis without necrosis or infection: Secondary | ICD-10-CM

## 2015-07-24 DIAGNOSIS — Z7982 Long term (current) use of aspirin: Secondary | ICD-10-CM | POA: Insufficient documentation

## 2015-07-24 DIAGNOSIS — Z3202 Encounter for pregnancy test, result negative: Secondary | ICD-10-CM | POA: Insufficient documentation

## 2015-07-24 DIAGNOSIS — Z720 Tobacco use: Secondary | ICD-10-CM | POA: Diagnosis not present

## 2015-07-24 DIAGNOSIS — Z79899 Other long term (current) drug therapy: Secondary | ICD-10-CM | POA: Insufficient documentation

## 2015-07-24 DIAGNOSIS — R1011 Right upper quadrant pain: Secondary | ICD-10-CM | POA: Diagnosis not present

## 2015-07-24 DIAGNOSIS — I10 Essential (primary) hypertension: Secondary | ICD-10-CM | POA: Diagnosis not present

## 2015-07-24 DIAGNOSIS — K861 Other chronic pancreatitis: Secondary | ICD-10-CM | POA: Insufficient documentation

## 2015-07-24 DIAGNOSIS — R109 Unspecified abdominal pain: Secondary | ICD-10-CM

## 2015-07-24 LAB — BASIC METABOLIC PANEL
Anion gap: 8 (ref 5–15)
BUN: 10 mg/dL (ref 6–20)
CHLORIDE: 107 mmol/L (ref 101–111)
CO2: 23 mmol/L (ref 22–32)
Calcium: 9 mg/dL (ref 8.9–10.3)
Creatinine, Ser: 0.77 mg/dL (ref 0.44–1.00)
GFR calc Af Amer: >60 mL/min (ref >60)
GFR calc non Af Amer: >60 mL/min (ref >60)
GLUCOSE: 95 mg/dL (ref 65–99)
POTASSIUM: 4.1 mmol/L (ref 3.5–5.1)
Sodium: 138 mmol/L (ref 135–145)

## 2015-07-24 LAB — CBC
HEMATOCRIT: 42.1 % (ref 35.0–47.0)
Hemoglobin: 14.6 g/dL (ref 12.0–16.0)
MCH: 31.8 pg (ref 26.0–34.0)
MCHC: 34.7 g/dL (ref 32.0–36.0)
MCV: 91.6 fL (ref 80.0–100.0)
Platelets: 197 10*3/uL (ref 150–440)
RBC: 4.59 MIL/uL (ref 3.80–5.20)
RDW: 13.3 % (ref 11.5–14.5)
WBC: 10.8 10*3/uL (ref 3.6–11.0)

## 2015-07-24 LAB — URINALYSIS COMPLETE WITH MICROSCOPIC (ARMC ONLY)
BILIRUBIN URINE: NEGATIVE
Bacteria, UA: NONE SEEN
Glucose, UA: NEGATIVE mg/dL
HGB URINE DIPSTICK: NEGATIVE
KETONES UR: NEGATIVE mg/dL
LEUKOCYTES UA: NEGATIVE
NITRITE: NEGATIVE
PH: 6 (ref 5.0–8.0)
PROTEIN: NEGATIVE mg/dL
RBC / HPF: NONE SEEN RBC/hpf (ref 0–5)
SPECIFIC GRAVITY, URINE: 1.005 (ref 1.005–1.030)

## 2015-07-24 LAB — HEPATIC FUNCTION PANEL
ALT: 10 U/L — ABNORMAL LOW (ref 14–54)
AST: 16 U/L (ref 15–41)
Albumin: 4 g/dL (ref 3.5–5.0)
Alkaline Phosphatase: 62 U/L (ref 38–126)
BILIRUBIN TOTAL: 0.3 mg/dL (ref 0.3–1.2)
Total Protein: 7 g/dL (ref 6.5–8.1)

## 2015-07-24 LAB — TROPONIN I: Troponin I: <0.03 ng/mL (ref <0.031)

## 2015-07-24 LAB — LIPASE, BLOOD: LIPASE: 113 U/L — AB (ref 22–51)

## 2015-07-24 LAB — POC URINE PREG, ED: PREG TEST UR: NEGATIVE — NL

## 2015-07-24 MED ORDER — MORPHINE SULFATE (PF) 4 MG/ML IV SOLN
4.0000 mg | Freq: Once | INTRAVENOUS | Status: AC
  Administered 2015-07-24: 4 mg via INTRAVENOUS

## 2015-07-24 MED ORDER — ONDANSETRON HCL 4 MG/2ML IJ SOLN
INTRAMUSCULAR | Status: AC
  Administered 2015-07-24: 4 mg via INTRAVENOUS
  Filled 2015-07-24: qty 2

## 2015-07-24 MED ORDER — ONDANSETRON HCL 4 MG/2ML IJ SOLN
4.0000 mg | Freq: Once | INTRAMUSCULAR | Status: AC
  Administered 2015-07-24: 4 mg via INTRAVENOUS
  Filled 2015-07-24: qty 2

## 2015-07-24 MED ORDER — IOHEXOL 240 MG/ML SOLN
25.0000 mL | Freq: Once | INTRAMUSCULAR | Status: AC | PRN
  Administered 2015-07-24: 25 mL via ORAL

## 2015-07-24 MED ORDER — MORPHINE SULFATE (PF) 4 MG/ML IV SOLN
INTRAVENOUS | Status: AC
  Administered 2015-07-24: 4 mg via INTRAVENOUS
  Filled 2015-07-24: qty 1

## 2015-07-24 MED ORDER — ONDANSETRON HCL 4 MG/2ML IJ SOLN
4.0000 mg | Freq: Once | INTRAMUSCULAR | Status: AC
Start: 2015-07-24 — End: 2015-07-24
  Administered 2015-07-24: 4 mg via INTRAVENOUS

## 2015-07-24 MED ORDER — IOHEXOL 300 MG/ML  SOLN
100.0000 mL | Freq: Once | INTRAMUSCULAR | Status: AC | PRN
  Administered 2015-07-24: 100 mL via INTRAVENOUS

## 2015-07-24 NOTE — ED Provider Notes (Signed)
[facility] Emergency Department Provider Note  ____________________________________________  Time seen: Approximately 4:18 PM  I have reviewed the triage vital signs and the nursing notes.   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 This does not happen if she climbs stairs he does other physical activity. Patient also has pain in the right upper quadrant of her abdomen usually about a half an hour after eating. This is been going on for several days. She also says she vomited up dark stringy cereal one time in the ER that looked like it might of been blood. She feels like there is a knot in the right upper quadrant just at the edge of the ribs. She reports the discomfort in the right upper quadrant is pressure-like he further reports that she's had very thin pencil like stools for about 2 weeks. She has a history of "kidney issues" and pancreatitis in the past she says the pancreatitis was so long ago she really doesn't what it felt like anymore.   Past Medical History  Diagnosis Date  . Anxiety   . Mitral valve prolapse   . Hypertension   . CHF (congestive heart failure)   . Kidney infection     There are no active problems to display for this patient.   History reviewed. No pertinent past surgical history.  Current Outpatient Rx  Name  Route  Sig  Dispense  Refill  . acetaminophen (TYLENOL) 500 MG tablet   Oral   Take 1,000 mg by mouth every 6 (six) hours as needed for mild pain or headache.         Marland Kitchen aspirin EC 81 MG tablet   Oral   Take 81 mg by mouth daily.         Marland Kitchen atorvastatin (LIPITOR) 20 MG tablet   Oral   Take 20 mg by mouth at bedtime.         . naproxen sodium (ANAPROX) 220 MG tablet   Oral   Take 220 mg by mouth every 6 (six) hours as needed (for pain).          . pregabalin  (LYRICA) 50 MG capsule   Oral   Take 50 mg by mouth 3 (three) times daily.         Marland Kitchen HYDROcodone-acetaminophen (NORCO) 5-325 MG per tablet   Oral   Take 1 tablet by mouth every 6 (six) hours as needed for moderate pain. Patient not taking: Reported on 07/24/2015   15 tablet   0   . ibuprofen (ADVIL,MOTRIN) 800 MG tablet   Oral   Take 1 tablet (800 mg total) by mouth every 8 (eight) hours as needed for moderate pain. Patient not taking: Reported on 07/24/2015   15 tablet   0   . traMADol (ULTRAM) 50 MG tablet   Oral   Take 1 tablet (50 mg total) by mouth every 6 (six) hours as needed for pain. Patient not taking: Reported on 07/24/2015   15 tablet   0   . traMADol (ULTRAM) 50 MG tablet   Oral   Take 1 tablet (50 mg total) by mouth every 6 (six) hours as needed for moderate pain. Patient not taking: Reported on 07/24/2015   12 tablet   0     Allergies Sulfa antibiotics  No family history on file.  Social History Social History  Substance Use Topics  . Smoking status: Current Every Day Smoker -- 1.00 packs/day for 36 years    Types: Cigarettes  . Smokeless tobacco: Never Used  . Alcohol Use: No    Review of Systems Constitutional: No fever/chills Eyes: No visual changes. ENT: No sore throat. Cardiovascular: See history of present illness Respiratory: Denies shortness of breath. Gastrointestinal: See history of present illness No nausea, no vomiting.  No diarrhea.  No constipation. Genitourinary: Negative for dysuria. Musculoskeletal: Negative for back pain. Skin: Negative for rash. Neurological: Negative for headaches, focal weakness or numbness.    10-point ROS otherwise negative.  ____________________________________________   PHYSICAL EXAM:  VITAL SIGNS: ED Triage Vitals  Enc Vitals Group     BP 07/24/15 1529 148/48 mmHg     Pulse Rate 07/24/15 1219 64     Resp 07/24/15 1219 20     Temp 07/24/15 1219 98.3 F (36.8 C)     Temp Source 07/24/15 1219  Oral     SpO2 07/24/15 1219 99 %     Weight 07/24/15 1219 190 lb (86.183 kg)     Height 07/24/15 1219 5\' 3"  (1.6 m)     Head Cir --      Peak Flow --      Pain Score 07/24/15 1220 7     Pain Loc --      Pain Edu? --      Excl. in [place]? --     Constitutional: Alert and oriented. Well appearing and in no acute distress. Eyes: Conjunctivae are normal. PERRL. EOMI. Head: Atraumatic. Nose: No congestion/rhinnorhea. Mouth/Throat: Mucous membranes are moist.  Oropharynx non-erythematous. Neck: No stridor. Cardiovascular: Normal rate, regular rhythm. Grossly normal heart sounds.  Good peripheral circulation. Respiratory: Normal respiratory effort.  No retractions. Lungs CTAB. Gastrointestinal: Soft and nontender there is some discomfort on palpation of the right upper quadrant but very mild. No distention. No abdominal bruits. No CVA tenderness. Rectal exam: There are no masses in the rectum stool is Hemoccult-negative Musculoskeletal: No lower extremity tenderness nor edema.  No joint effusions. Neurologic:  Normal speech and language. No gross focal neurologic deficits are appreciated. No gait instability. Skin:  Skin is warm, dry and intact. No rash noted. Psychiatric: Mood and affect are normal. Speech and behavior are normal.  ____________________________________________   LABS (all labs ordered are listed, but only abnormal results are displayed)  Labs Reviewed  LIPASE, BLOOD - Abnormal; Notable for the following:    Lipase 113 (*)    All other components within normal limits  URINALYSIS COMPLETEWITH MICROSCOPIC ([facility] ONLY) - Abnormal; Notable for the following:    Color, Urine STRAW (*)    APPearance HAZY (*)    Squamous Epithelial / LPF 6-30 (*)    All other components within normal limits  HEPATIC FUNCTION PANEL - Abnormal; Notable for the following:    ALT 10 (*)    Bilirubin, Direct <0.1 (*)    All other components within normal limits  BASIC METABOLIC PANEL  CBC   TROPONIN I  POC URINE PREG, ED   ____________________________________________  EKG  EKG read and interpreted by me shows sinus bradycardia at a rate of 54 normal axis essentially normal EKG ____________________________________________  RADIOLOGY  Ultrasound the right upper quadrant is normal per radiology. Chest x-ray is all per radiology. CT of the belly does not show any pathology either. ____________________________________________   PROCEDURES    ____________________________________________  INITIAL IMPRESSION / ASSESSMENT AND PLAN / ED COURSE  Pertinent labs & imaging results that were available during my care of the patient were reviewed by me and considered in my medical decision making (see chart for details).  Patient reports she has a cardiologist I will ask her to follow up with cardiologist. I will also ask her to follow-up with her family doctor for the belly pain in the very mild pancreatitis she's having. She is to return for any further problems. ____________________________________________   FINAL CLINICAL IMPRESSION(S) / ED DIAGNOSES  Final diagnoses:  Pain  Abdominal pain, unspecified abdominal location  Chest pain, unspecified chest pain type  Other pancreatitis      Nena Polio, MD 07/24/15 [phone]

## 2015-07-24 NOTE — ED Notes (Signed)
Pt reports RUQ pain. MD notified.

## 2015-07-24 NOTE — ED Notes (Signed)
Pt reports chest pain/ burning starting yesterday, pt reports pain increases when smoking, pt reports shortness of breath

## 2015-07-24 NOTE — ED Notes (Signed)
Called lab about newly added orders to see if correct tubes were already in lab. Lab reports they can add tests to the current blood in lab.

## 2015-07-24 NOTE — ED Notes (Signed)
Pt reports nausea. MD notified.  

## 2015-07-24 NOTE — ED Notes (Signed)
MD Malinda at bedside. 

## 2015-07-24 NOTE — Discharge Instructions (Signed)
Abdominal Pain, Women °Abdominal (stomach, pelvic, or belly) pain can be caused by many things. It is important to tell your doctor: °· The location of the pain. °· Does it come and go or is it present all the time? °· Are there things that start the pain (eating certain foods, exercise)? °· Are there other symptoms associated with the pain (fever, nausea, vomiting, diarrhea)? °All of this is helpful to know when trying to find the cause of the pain. °CAUSES  °· Stomach: virus or bacteria infection, or ulcer. °· Intestine: appendicitis (inflamed appendix), regional ileitis (Crohn's disease), ulcerative colitis (inflamed colon), irritable bowel syndrome, diverticulitis (inflamed diverticulum of the colon), or cancer of the stomach or intestine. °· Gallbladder disease or stones in the gallbladder. °· Kidney disease, kidney stones, or infection. °· Pancreas infection or cancer. °· Fibromyalgia (pain disorder). °· Diseases of the female organs: °¨ Uterus: fibroid (non-cancerous) tumors or infection. °¨ Fallopian tubes: infection or tubal pregnancy. °¨ Ovary: cysts or tumors. °¨ Pelvic adhesions (scar tissue). °¨ Endometriosis (uterus lining tissue growing in the pelvis and on the pelvic organs). °¨ Pelvic congestion syndrome (female organs filling up with blood just before the menstrual period). °¨ Pain with the menstrual period. °¨ Pain with ovulation (producing an egg). °¨ Pain with an IUD (intrauterine device, birth control) in the uterus. °¨ Cancer of the female organs. °· Functional pain (pain not caused by a disease, may improve without treatment). °· Psychological pain. °· Depression. °DIAGNOSIS  °Your doctor will decide the seriousness of your pain by doing an examination. °· Blood tests. °· X-rays. °· Ultrasound. °· CT scan (computed tomography, special type of X-ray). °· MRI (magnetic resonance imaging). °· Cultures, for infection. °· Barium enema (dye inserted in the large intestine, to better view it with  X-rays). °· Colonoscopy (looking in intestine with a lighted tube). °· Laparoscopy (minor surgery, looking in abdomen with a lighted tube). °· Major abdominal exploratory surgery (looking in abdomen with a large incision). °TREATMENT  °The treatment will depend on the cause of the pain.  °· Many cases can be observed and treated at home. °· Over-the-counter medicines recommended by your caregiver. °· Prescription medicine. °· Antibiotics, for infection. °· Birth control pills, for painful periods or for ovulation pain. °· Hormone treatment, for endometriosis. °· Nerve blocking injections. °· Physical therapy. °· Antidepressants. °· Counseling with a psychologist or psychiatrist. °· Minor or major surgery. °HOME CARE INSTRUCTIONS  °· Do not take laxatives, unless directed by your caregiver. °· Take over-the-counter pain medicine only if ordered by your caregiver. Do not take aspirin because it can cause an upset stomach or bleeding. °· Try a clear liquid diet (broth or water) as ordered by your caregiver. Slowly move to a bland diet, as tolerated, if the pain is related to the stomach or intestine. °· Have a thermometer and take your temperature several times a day, and record it. °· Bed rest and sleep, if it helps the pain. °· Avoid sexual intercourse, if it causes pain. °· Avoid stressful situations. °· Keep your follow-up appointments and tests, as your caregiver orders. °· If the pain does not go away with medicine or surgery, you may try: °¨ Acupuncture. °¨ Relaxation exercises (yoga, meditation). °¨ Group therapy. °¨ Counseling. °SEEK MEDICAL CARE IF:  °· You notice certain foods cause stomach pain. °· Your home care treatment is not helping your pain. °· You need stronger pain medicine. °· You want your IUD removed. °· You feel faint or   lightheaded. °· You develop nausea and vomiting. °· You develop a rash. °· You are having side effects or an allergy to your medicine. °SEEK IMMEDIATE MEDICAL CARE IF:  °· Your  pain does not go away or gets worse. °· You have a fever. °· Your pain is felt only in portions of the abdomen. The right side could possibly be appendicitis. The left lower portion of the abdomen could be colitis or diverticulitis. °· You are passing blood in your stools (bright red or black tarry stools, with or without vomiting). °· You have blood in your urine. °· You develop chills, with or without a fever. °· You pass out. °MAKE SURE YOU:  °· Understand these instructions. °· Will watch your condition. °· Will get help right away if you are not doing well or get worse. °Document Released: 09/01/2007 Document Revised: 03/21/2014 Document Reviewed: 09/21/2009 °[employer]® Patient Information ©2015 [employer]. This information is not intended to replace advice given to you by your health care provider. Make sure you discuss any questions you have with your health care provider. ° °

## 2015-07-24 NOTE — ED Notes (Signed)
Pt in ultrasound at this time

## 2015-08-30 ENCOUNTER — Emergency Department
Admission: EM | Admit: 2015-08-30 | Discharge: 2015-08-30 | Disposition: A | Discharge status: Home / Self Care | Payer: Medicaid Other | Attending: Emergency Medicine | Admitting: Emergency Medicine

## 2015-08-30 ENCOUNTER — Emergency Department: Payer: Medicaid Other

## 2015-08-30 DIAGNOSIS — Z79899 Other long term (current) drug therapy: Secondary | ICD-10-CM | POA: Insufficient documentation

## 2015-08-30 DIAGNOSIS — I10 Essential (primary) hypertension: Secondary | ICD-10-CM | POA: Diagnosis not present

## 2015-08-30 DIAGNOSIS — Z7982 Long term (current) use of aspirin: Secondary | ICD-10-CM | POA: Diagnosis not present

## 2015-08-30 DIAGNOSIS — R109 Unspecified abdominal pain: Secondary | ICD-10-CM | POA: Insufficient documentation

## 2015-08-30 DIAGNOSIS — R079 Chest pain, unspecified: Secondary | ICD-10-CM | POA: Diagnosis not present

## 2015-08-30 DIAGNOSIS — Z720 Tobacco use: Secondary | ICD-10-CM | POA: Insufficient documentation

## 2015-08-30 LAB — CBC
HCT: 43.3 % (ref 35.0–47.0)
HEMOGLOBIN: 15 g/dL (ref 12.0–16.0)
MCH: 31 pg (ref 26.0–34.0)
MCHC: 34.8 g/dL (ref 32.0–36.0)
MCV: 89.1 fL (ref 80.0–100.0)
Platelets: 198 10*3/uL (ref 150–440)
RBC: 4.86 MIL/uL (ref 3.80–5.20)
RDW: 13.4 % (ref 11.5–14.5)
WBC: 9.8 10*3/uL (ref 3.6–11.0)

## 2015-08-30 LAB — COMPREHENSIVE METABOLIC PANEL
ALK PHOS: 67 U/L (ref 38–126)
ALT: 13 U/L — ABNORMAL LOW (ref 14–54)
ANION GAP: 7 (ref 5–15)
AST: 15 U/L (ref 15–41)
Albumin: 4.4 g/dL (ref 3.5–5.0)
BILIRUBIN TOTAL: 0.5 mg/dL (ref 0.3–1.2)
BUN: 11 mg/dL (ref 6–20)
CALCIUM: 9.5 mg/dL (ref 8.9–10.3)
CO2: 26 mmol/L (ref 22–32)
Chloride: 105 mmol/L (ref 101–111)
Creatinine, Ser: 0.75 mg/dL (ref 0.44–1.00)
GFR calc non Af Amer: >60 mL/min (ref >60)
Glucose, Bld: 97 mg/dL (ref 65–99)
Potassium: 3.6 mmol/L (ref 3.5–5.1)
SODIUM: 138 mmol/L (ref 135–145)
TOTAL PROTEIN: 7.9 g/dL (ref 6.5–8.1)

## 2015-08-30 LAB — TROPONIN I: Troponin I: <0.03 ng/mL (ref <0.031)

## 2015-08-30 MED ORDER — PANTOPRAZOLE SODIUM 40 MG PO TBEC: 40.0000 mg | DELAYED_RELEASE_TABLET | Freq: Every day | ORAL | Status: DC

## 2015-08-30 MED ORDER — LORAZEPAM 1 MG PO TABS
1.0000 mg | ORAL_TABLET | Freq: Once | ORAL | Status: AC
  Administered 2015-08-30: 1 mg via ORAL
  Filled 2015-08-30: qty 1

## 2015-08-30 MED ORDER — SUCRALFATE 1 G PO TABS: 1.0000 g | ORAL_TABLET | Freq: Four times a day (QID) | ORAL | Status: DC

## 2015-08-30 MED ORDER — GI COCKTAIL ~~LOC~~
30.0000 mL | Freq: Once | ORAL | Status: AC
  Administered 2015-08-30: 30 mL via ORAL
  Filled 2015-08-30: qty 30

## 2015-08-30 NOTE — ED Notes (Signed)
Pain in chest and abd on and off .  Says this is not new, but went to doc about abd pain today and they sent her here due to chest,.

## 2015-08-30 NOTE — ED Provider Notes (Signed)
[facility] Emergency Department Provider Note    ____________________________________________  Time seen: 1800  I have reviewed the triage vital signs and the nursing notes.   HISTORY  Chief Complaint Abdominal Pain and Chest Pain   History limited by: Not Limited   HPI Monique Greer is a 44 y.o. female who presents to the emergency department today primarily with concerns for chest pain. The patient was sent here from her primary care doctor's office. The patient states she has been having issues with abdominal pain for roughly 1 month. She states she had been diagnosed with pancreatitis in the past. She states she was still having some abdominal pain and thus went to her primary care doctor. They are arranging for her to see a GI doctor. She states that whilst at the primary care doctor's office she mentioned she is been having some chest pain. She does state that she is under a large amount of stress recently. She thinks that most likely that the pain is secondary to anxiety and possibly ulcers.     Past Medical History  Diagnosis Date  . Anxiety   . Mitral valve prolapse   . Hypertension   . CHF (congestive heart failure) ([place])   . Kidney infection     There are no active problems to display for this patient.   History reviewed. No pertinent past surgical history.  Current Outpatient Rx  Name  Route  Sig  Dispense  Refill  . acetaminophen (TYLENOL) 500 MG tablet   Oral   Take 1,000 mg by mouth every 6 (six) hours as needed for mild pain or headache.         Marland Kitchen aspirin EC 81 MG tablet   Oral   Take 81 mg by mouth daily.         Marland Kitchen atorvastatin (LIPITOR) 20 MG tablet   Oral   Take 20 mg by mouth at bedtime.         Marland Kitchen HYDROcodone-acetaminophen (NORCO) 5-325 MG per tablet   Oral   Take 1 tablet by mouth every 6 (six) hours as needed for moderate pain. Patient not taking: Reported on 07/24/2015   15 tablet   0   . ibuprofen  (ADVIL,MOTRIN) 800 MG tablet   Oral   Take 1 tablet (800 mg total) by mouth every 8 (eight) hours as needed for moderate pain. Patient not taking: Reported on 07/24/2015   15 tablet   0   . naproxen sodium (ANAPROX) 220 MG tablet   Oral   Take 220 mg by mouth every 6 (six) hours as needed (for pain).          . pregabalin (LYRICA) 50 MG capsule   Oral   Take 50 mg by mouth 3 (three) times daily.         . traMADol (ULTRAM) 50 MG tablet   Oral   Take 1 tablet (50 mg total) by mouth every 6 (six) hours as needed for pain. Patient not taking: Reported on 07/24/2015   15 tablet   0   . traMADol (ULTRAM) 50 MG tablet   Oral   Take 1 tablet (50 mg total) by mouth every 6 (six) hours as needed for moderate pain. Patient not taking: Reported on 07/24/2015   12 tablet   0     Allergies Sulfa antibiotics  History reviewed. No pertinent family history.  Social History Social History  Substance Use Topics  . Smoking status: Current Every  Day Smoker -- 1.00 packs/day for 36 years    Types: Cigarettes  . Smokeless tobacco: Never Used  . Alcohol Use: No    Review of Systems  Constitutional: Negative for fever. Cardiovascular: Positive for chest pain. Respiratory: Negative for shortness of breath. Gastrointestinal: Positive for abdominal pain Genitourinary: Negative for dysuria. Musculoskeletal: Negative for back pain. Skin: Negative for rash. Neurological: Negative for headaches, focal weakness or numbness.   10-point ROS otherwise negative.  ____________________________________________   PHYSICAL EXAM:  VITAL SIGNS: ED Triage Vitals  Enc Vitals Group     BP 08/30/15 1410 176/75 mmHg     Pulse Rate 08/30/15 1410 78     Resp 08/30/15 1410 16     Temp 08/30/15 1410 98.6 F (37 C)     Temp src --      SpO2 08/30/15 1410 97 %     Weight 08/30/15 1410 191 lb (86.637 kg)     Height 08/30/15 1410 5\' 3"  (1.6 m)     Head Cir --      Peak Flow --      Pain Score  08/30/15 1411 5   Constitutional: Alert and oriented. Well appearing and in no distress. Eyes: Conjunctivae are normal. PERRL. Normal extraocular movements. ENT   Head: Normocephalic and atraumatic.   Nose: No congestion/rhinnorhea.   Mouth/Throat: Mucous membranes are moist.   Neck: No stridor. Hematological/Lymphatic/Immunilogical: No cervical lymphadenopathy. Cardiovascular: Normal rate, regular rhythm.  No murmurs, rubs, or gallops. Respiratory: Normal respiratory effort without tachypnea nor retractions. Breath sounds are clear and equal bilaterally. No wheezes/rales/rhonchi. Gastrointestinal: Soft and nontender. No distention.  Genitourinary: Deferred Musculoskeletal: Normal range of motion in all extremities. No joint effusions.  No lower extremity tenderness nor edema. Neurologic:  Normal speech and language. No gross focal neurologic deficits are appreciated. Speech is normal.  Skin:  Skin is warm, dry and intact. No rash noted. Psychiatric: Mood and affect are normal. Speech and behavior are normal. Patient exhibits appropriate insight and judgment.  ____________________________________________    LABS (pertinent positives/negatives)  Labs Reviewed  COMPREHENSIVE METABOLIC PANEL - Abnormal; Notable for the following:    ALT 13 (*)    All other components within normal limits  CBC  TROPONIN I     ____________________________________________   EKG  I, Nance Pear, attending physician, personally viewed and interpreted this EKG  EKG Time: 1422 Rate: 75 Rhythm: NSR Axis: normal Intervals: qtc 433 QRS: narrow ST changes: no st elevation Impression: normal EKG   ____________________________________________    RADIOLOGY  CXR IMPRESSION: No edema or consolidation.  I, Shayn Madole, personally viewed and evaluated these images (plain radiographs) as part of my medical decision  making. ____________________________________________   PROCEDURES  Procedure(s) performed: None  Critical Care performed: No  ____________________________________________   INITIAL IMPRESSION / ASSESSMENT AND PLAN / ED COURSE  Pertinent labs & imaging results that were available during my care of the patient were reviewed by me and considered in my medical decision making (see chart for details).  Patient presented to the emergency department today with concerns for chest pain and abdominal pain. 2 sets of troponin were negative here. The patient was given a GI cocktail which she stated helped her symptoms. I do wonder if a large part of the patient's symptoms are secondary to anxiety and stress. This point given the negative troponin I think ACS unlikely however I will recommend cardiology follow-up. Will have patient follow-up with her primary care doctor for continued abdominal pain  workup.  ____________________________________________   FINAL CLINICAL IMPRESSION(S) / ED DIAGNOSES  Final diagnoses:  Chest pain, unspecified chest pain type  Abdominal pain, unspecified abdominal location     Nance Pear, MD 08/30/15 2000

## 2015-08-30 NOTE — Discharge Instructions (Signed)
Please seek medical attention for any high fevers, chest pain, shortness of breath, change in behavior, persistent vomiting, bloody stool or any other new or concerning symptoms. ° ° °Nonspecific Chest Pain  °Chest pain can be caused by many different conditions. There is always a chance that your pain could be related to something serious, such as a heart attack or a blood clot in your lungs. Chest pain can also be caused by conditions that are not life-threatening. If you have chest pain, it is very important to follow up with your health care provider. °CAUSES  °Chest pain can be caused by: °· Heartburn. °· Pneumonia or bronchitis. °· Anxiety or stress. °· Inflammation around your heart (pericarditis) or lung (pleuritis or pleurisy). °· A blood clot in your lung. °· A collapsed lung (pneumothorax). It can develop suddenly on its own (spontaneous pneumothorax) or from trauma to the chest. °· Shingles infection (varicella-zoster virus). °· Heart attack. °· Damage to the bones, muscles, and cartilage that make up your chest wall. This can include: °¨ Bruised bones due to injury. °¨ Strained muscles or cartilage due to frequent or repeated coughing or overwork. °¨ Fracture to one or more ribs. °¨ Sore cartilage due to inflammation (costochondritis). °RISK FACTORS  °Risk factors for chest pain may include: °· Activities that increase your risk for trauma or injury to your chest. °· Respiratory infections or conditions that cause frequent coughing. °· Medical conditions or overeating that can cause heartburn. °· Heart disease or family history of heart disease. °· Conditions or health behaviors that increase your risk of developing a blood clot. °· Having had chicken pox (varicella zoster). °SIGNS AND SYMPTOMS °Chest pain can feel like: °· Burning or tingling on the surface of your chest or deep in your chest. °· Crushing, pressure, aching, or squeezing pain. °· Dull or sharp pain that is worse when you move, cough, or  take a deep breath. °· Pain that is also felt in your back, neck, shoulder, or arm, or pain that spreads to any of these areas. °Your chest pain may come and go, or it may stay constant. °DIAGNOSIS °Lab tests or other studies may be needed to find the cause of your pain. Your health care provider may have you take a test called an ambulatory ECG (electrocardiogram). An ECG records your heartbeat patterns at the time the test is performed. You may also have other tests, such as: °· Transthoracic echocardiogram (TTE). During echocardiography, sound waves are used to create a picture of all of the heart structures and to look at how blood flows through your heart. °· Transesophageal echocardiogram (TEE). This is a more advanced imaging test that obtains images from inside your body. It allows your health care provider to see your heart in finer detail. °· Cardiac monitoring. This allows your health care provider to monitor your heart rate and rhythm in real time. °· Holter monitor. This is a portable device that records your heartbeat and can help to diagnose abnormal heartbeats. It allows your health care provider to track your heart activity for several days, if needed. °· Stress tests. These can be done through exercise or by taking medicine that makes your heart beat more quickly. °· Blood tests. °· Imaging tests. °TREATMENT  °Your treatment depends on what is causing your chest pain. Treatment may include: °· Medicines. These may include: °¨ Acid blockers for heartburn. °¨ Anti-inflammatory medicine. °¨ Pain medicine for inflammatory conditions. °¨ Antibiotic medicine, if an infection is present. °¨ Medicines   to dissolve blood clots. °¨ Medicines to treat coronary artery disease. °· Supportive care for conditions that do not require medicines. This may include: °¨ Resting. °¨ Applying heat or cold packs to injured areas. °¨ Limiting activities until pain decreases. °HOME CARE INSTRUCTIONS °· If you were prescribed  an antibiotic medicine, finish it all even if you start to feel better. °· Avoid any activities that bring on chest pain. °· Do not use any tobacco products, including cigarettes, chewing tobacco, or electronic cigarettes. If you need help quitting, ask your health care provider. °· Do not drink alcohol. °· Take medicines only as directed by your health care provider. °· Keep all follow-up visits as directed by your health care provider. This is important. This includes any further testing if your chest pain does not go away. °· If heartburn is the cause for your chest pain, you may be told to keep your head raised (elevated) while sleeping. This reduces the chance that acid will go from your stomach into your esophagus. °· Make lifestyle changes as directed by your health care provider. These may include: °¨ Getting regular exercise. Ask your health care provider to suggest some activities that are safe for you. °¨ Eating a heart-healthy diet. A registered dietitian can help you to learn healthy eating options. °¨ Maintaining a healthy weight. °¨ Managing diabetes, if necessary. °¨ Reducing stress. °SEEK MEDICAL CARE IF: °· Your chest pain does not go away after treatment. °· You have a rash with blisters on your chest. °· You have a fever. °SEEK IMMEDIATE MEDICAL CARE IF:  °· Your chest pain is worse. °· You have an increasing cough, or you cough up blood. °· You have severe abdominal pain. °· You have severe weakness. °· You faint. °· You have chills. °· You have sudden, unexplained chest discomfort. °· You have sudden, unexplained discomfort in your arms, back, neck, or jaw. °· You have shortness of breath at any time. °· You suddenly start to sweat, or your skin gets clammy. °· You feel nauseous or you vomit. °· You suddenly feel light-headed or dizzy. °· Your heart begins to beat quickly, or it feels like it is skipping beats. °These symptoms may represent a serious problem that is an emergency. Do not wait to  see if the symptoms will go away. Get medical help right away. Call your local emergency services (911 in the [place] Do not drive yourself to the hospital. °  °This information is not intended to replace advice given to you by your health care provider. Make sure you discuss any questions you have with your health care provider. °  °Document Released: 08/14/2005 Document Revised: 11/25/2014 Document Reviewed: 06/10/2014 °Elsevier Interactive Patient Education ©2016 [employer] ° °

## 2015-09-15 ENCOUNTER — Emergency Department: Payer: Medicaid Other

## 2015-09-15 ENCOUNTER — Emergency Department
Admission: EM | Admit: 2015-09-15 | Discharge: 2015-09-15 | Discharge status: Against Medical Advice | Payer: Medicaid Other | Attending: Emergency Medicine | Admitting: Emergency Medicine

## 2015-09-15 DIAGNOSIS — I10 Essential (primary) hypertension: Secondary | ICD-10-CM | POA: Insufficient documentation

## 2015-09-15 DIAGNOSIS — Z720 Tobacco use: Secondary | ICD-10-CM | POA: Insufficient documentation

## 2015-09-15 DIAGNOSIS — M25511 Pain in right shoulder: Secondary | ICD-10-CM | POA: Diagnosis not present

## 2015-09-15 DIAGNOSIS — R079 Chest pain, unspecified: Secondary | ICD-10-CM | POA: Diagnosis present

## 2015-09-15 LAB — CBC
HCT: 41 % (ref 35.0–47.0)
Hemoglobin: 14.2 g/dL (ref 12.0–16.0)
MCH: 30.9 pg (ref 26.0–34.0)
MCHC: 34.5 g/dL (ref 32.0–36.0)
MCV: 89.5 fL (ref 80.0–100.0)
PLATELETS: 185 10*3/uL (ref 150–440)
RBC: 4.58 MIL/uL (ref 3.80–5.20)
RDW: 13.5 % (ref 11.5–14.5)
WBC: 10.5 10*3/uL (ref 3.6–11.0)

## 2015-09-15 LAB — BASIC METABOLIC PANEL
Anion gap: 6 (ref 5–15)
BUN: 10 mg/dL (ref 6–20)
CALCIUM: 8.9 mg/dL (ref 8.9–10.3)
CO2: 22 mmol/L (ref 22–32)
CREATININE: 0.8 mg/dL (ref 0.44–1.00)
Chloride: 109 mmol/L (ref 101–111)
GFR calc Af Amer: >60 mL/min (ref >60)
GLUCOSE: 95 mg/dL (ref 65–99)
Potassium: 3.5 mmol/L (ref 3.5–5.1)
Sodium: 137 mmol/L (ref 135–145)

## 2015-09-15 LAB — TROPONIN I: Troponin I: <0.03 ng/mL (ref <0.031)

## 2015-09-15 NOTE — ED Notes (Signed)
Patient complains of left lateral chest pain and right shoulder pain.

## 2015-10-11 ENCOUNTER — Encounter: Payer: Self-pay | Admitting: *Deleted

## 2015-10-16 ENCOUNTER — Ambulatory Visit: Payer: Medicaid Other | Admitting: Anesthesiology

## 2015-10-16 ENCOUNTER — Ambulatory Visit
Admission: RE | Admit: 2015-10-16 | Discharge: 2015-10-16 | Disposition: A | Discharge status: Home / Self Care | Payer: Medicaid Other | Source: Ambulatory Visit | Attending: Gastroenterology | Admitting: Gastroenterology

## 2015-10-16 ENCOUNTER — Encounter
Admission: RE | Disposition: A | Discharge status: Home / Self Care | Payer: Self-pay | Source: Ambulatory Visit | Attending: Gastroenterology

## 2015-10-16 ENCOUNTER — Encounter: Payer: Self-pay | Admitting: *Deleted

## 2015-10-16 DIAGNOSIS — K449 Diaphragmatic hernia without obstruction or gangrene: Secondary | ICD-10-CM | POA: Diagnosis not present

## 2015-10-16 DIAGNOSIS — I38 Endocarditis, valve unspecified: Secondary | ICD-10-CM | POA: Insufficient documentation

## 2015-10-16 DIAGNOSIS — R1013 Epigastric pain: Secondary | ICD-10-CM | POA: Insufficient documentation

## 2015-10-16 DIAGNOSIS — Z841 Family history of disorders of kidney and ureter: Secondary | ICD-10-CM | POA: Diagnosis not present

## 2015-10-16 DIAGNOSIS — E785 Hyperlipidemia, unspecified: Secondary | ICD-10-CM | POA: Insufficient documentation

## 2015-10-16 DIAGNOSIS — Z882 Allergy status to sulfonamides status: Secondary | ICD-10-CM | POA: Insufficient documentation

## 2015-10-16 DIAGNOSIS — D123 Benign neoplasm of transverse colon: Secondary | ICD-10-CM | POA: Diagnosis not present

## 2015-10-16 DIAGNOSIS — Z825 Family history of asthma and other chronic lower respiratory diseases: Secondary | ICD-10-CM | POA: Insufficient documentation

## 2015-10-16 DIAGNOSIS — Z823 Family history of stroke: Secondary | ICD-10-CM | POA: Insufficient documentation

## 2015-10-16 DIAGNOSIS — R194 Change in bowel habit: Secondary | ICD-10-CM | POA: Diagnosis present

## 2015-10-16 DIAGNOSIS — K210 Gastro-esophageal reflux disease with esophagitis: Secondary | ICD-10-CM | POA: Diagnosis not present

## 2015-10-16 DIAGNOSIS — K5900 Constipation, unspecified: Secondary | ICD-10-CM | POA: Insufficient documentation

## 2015-10-16 DIAGNOSIS — Z8249 Family history of ischemic heart disease and other diseases of the circulatory system: Secondary | ICD-10-CM | POA: Diagnosis not present

## 2015-10-16 DIAGNOSIS — G43909 Migraine, unspecified, not intractable, without status migrainosus: Secondary | ICD-10-CM | POA: Diagnosis not present

## 2015-10-16 DIAGNOSIS — I509 Heart failure, unspecified: Secondary | ICD-10-CM | POA: Insufficient documentation

## 2015-10-16 DIAGNOSIS — Z800 Family history of malignant neoplasm of digestive organs: Secondary | ICD-10-CM | POA: Insufficient documentation

## 2015-10-16 DIAGNOSIS — I10 Essential (primary) hypertension: Secondary | ICD-10-CM | POA: Insufficient documentation

## 2015-10-16 DIAGNOSIS — D125 Benign neoplasm of sigmoid colon: Secondary | ICD-10-CM | POA: Diagnosis not present

## 2015-10-16 DIAGNOSIS — Z79899 Other long term (current) drug therapy: Secondary | ICD-10-CM | POA: Insufficient documentation

## 2015-10-16 DIAGNOSIS — F17200 Nicotine dependence, unspecified, uncomplicated: Secondary | ICD-10-CM | POA: Diagnosis not present

## 2015-10-16 DIAGNOSIS — R55 Syncope and collapse: Secondary | ICD-10-CM | POA: Diagnosis not present

## 2015-10-16 HISTORY — DX: Endocarditis, valve unspecified: I38

## 2015-10-16 HISTORY — DX: Hyperlipidemia, unspecified: E78.5

## 2015-10-16 HISTORY — DX: Migraine, unspecified, not intractable, without status migrainosus: G43.909

## 2015-10-16 HISTORY — PX: COLONOSCOPY WITH PROPOFOL: SHX5780

## 2015-10-16 HISTORY — PX: ESOPHAGOGASTRODUODENOSCOPY (EGD) WITH PROPOFOL: SHX5813

## 2015-10-16 HISTORY — DX: Syncope and collapse: R55

## 2015-10-16 SURGERY — COLONOSCOPY WITH PROPOFOL
Anesthesia: General

## 2015-10-16 MED ORDER — LABETALOL HCL 5 MG/ML IV SOLN
INTRAVENOUS | Status: DC | PRN
  Administered 2015-10-16: 10 mg via INTRAVENOUS

## 2015-10-16 MED ORDER — PROPOFOL 10 MG/ML IV BOLUS
INTRAVENOUS | Status: DC | PRN
  Administered 2015-10-16 (×4): 50 mg via INTRAVENOUS

## 2015-10-16 MED ORDER — FENTANYL CITRATE (PF) 100 MCG/2ML IJ SOLN
50.0000 ug | Freq: Once | INTRAMUSCULAR | Status: AC
  Administered 2015-10-16: 50 ug via INTRAVENOUS

## 2015-10-16 MED ORDER — PROPOFOL 500 MG/50ML IV EMUL
INTRAVENOUS | Status: DC | PRN
  Administered 2015-10-16: 150 ug/kg/min via INTRAVENOUS

## 2015-10-16 MED ORDER — SODIUM CHLORIDE 0.9 % IV SOLN
INTRAVENOUS | Status: DC
  Administered 2015-10-16 (×2): via INTRAVENOUS

## 2015-10-16 MED ORDER — FENTANYL CITRATE (PF) 100 MCG/2ML IJ SOLN
INTRAMUSCULAR | Status: AC
  Administered 2015-10-16: 50 ug via INTRAVENOUS
  Filled 2015-10-16: qty 2

## 2015-10-16 NOTE — Op Note (Signed)
[facility] Gastroenterology Patient Name: Monique Greer Procedure Date: 10/16/2015 10:55 AM MRN: [id number] Account #: [id number] Date of Birth: [birth year] Admit Type: Outpatient Age: 44 Room: [facility] ENDO ROOM 4 Gender: Female Note Status: Finalized Procedure:         Colonoscopy Indications:       Change in bowel habits Providers:         Lupita Dawn. Candace Cruise, MD Referring MD:      Forest Gleason Md, MD (Referring MD) Medicines:         Monitored Anesthesia Care Complications:     No immediate complications. Procedure:         Pre-Anesthesia Assessment:                    - Prior to the procedure, a History and Physical was                     performed, and patient medications, allergies and                     sensitivities were reviewed. The patient's tolerance of                     previous anesthesia was reviewed.                    - The risks and benefits of the procedure and the sedation                     options and risks were discussed with the patient. All                     questions were answered and informed consent was obtained.                    - After reviewing the risks and benefits, the patient was                     deemed in satisfactory condition to undergo the procedure.                    After obtaining informed consent, the colonoscope was                     passed under direct vision. Throughout the procedure, the                     patient's blood pressure, pulse, and oxygen saturations                     were monitored continuously. The Olympus CF-Q160AL                     colonoscope (S#. [id number]) was introduced through the anus                     and advanced to the the cecum, identified by appendiceal                     orifice and ileocecal valve. The colonoscopy was performed                     without difficulty. The patient tolerated the procedure  well. The quality of the bowel preparation was  good. Findings:      A medium polyp was found in the sigmoid colon. The polyp was sessile.       The polyp was removed with a hot snare. Resection and retrieval were       complete.      A diminutive polyp was found at the hepatic flexure. The polyp was       sessile. The polyp was removed with a jumbo cold forceps. Resection and       retrieval were complete.      The exam was otherwise without abnormality. Impression:        - One medium polyp in the sigmoid colon. Resected and                     retrieved.                    - One diminutive polyp at the hepatic flexure. Resected                     and retrieved.                    - The examination was otherwise normal. Recommendation:    - Discharge patient to home.                    - Await pathology results.                    - Repeat colonoscopy in 5 years for surveillance based on                     pathology results.                    - The findings and recommendations were discussed with the                     patient. Procedure Code(s): --- Professional ---                    [id number], Colonoscopy, flexible; with removal of tumor(s),                     polyp(s), or other lesion(s) by snare technique                    45380, 73, Colonoscopy, flexible; with biopsy, single or                     multiple Diagnosis Code(s): --- Professional ---                    D12.5, Benign neoplasm of sigmoid colon                    D12.3, Benign neoplasm of transverse colon                    R19.4, Change in bowel habit CPT copyright 2014 American Medical Association. All rights reserved. The codes documented in this report are preliminary and upon coder review may  be revised to meet current compliance requirements. Hulen Luster, MD 10/16/2015 11:18:30 AM This report has been signed electronically. Number of Addenda: 0 Note Initiated On: 10/16/2015 10:55 AM Scope Withdrawal Time: 0 hours 7 minutes  30 seconds  Total Procedure  Duration: 0 hours 9 minutes 40 seconds       [facility]

## 2015-10-16 NOTE — Anesthesia Preprocedure Evaluation (Signed)
Anesthesia Evaluation  Patient identified by MRN, date of birth, ID band Patient awake    Reviewed: Allergy & Precautions, NPO status , Patient's Chart, lab work & pertinent test results  Airway Mallampati: II       Dental  (+) Teeth Intact   Pulmonary COPD, Current Smoker,    + rhonchi        Cardiovascular hypertension, Pt. on medications  Rhythm:Regular Rate:Normal     Neuro/Psych    GI/Hepatic negative GI ROS, Neg liver ROS,   Endo/Other    Renal/GU      Musculoskeletal   Abdominal (+) + obese,   Peds  Hematology   Anesthesia Other Findings   Reproductive/Obstetrics                             Anesthesia Physical Anesthesia Plan  ASA: II  Anesthesia Plan: General   Post-op Pain Management:    Induction: Intravenous  Airway Management Planned: Nasal Cannula  Additional Equipment:   Intra-op Plan:   Post-operative Plan:   Informed Consent: I have reviewed the patients History and Physical, chart, labs and discussed the procedure including the risks, benefits and alternatives for the proposed anesthesia with the patient or authorized representative who has indicated his/her understanding and acceptance.     Plan Discussed with: CRNA  Anesthesia Plan Comments:         Anesthesia Quick Evaluation

## 2015-10-16 NOTE — Op Note (Signed)
[facility] Gastroenterology Patient Name: Monique Greer Procedure Date: 10/16/2015 10:56 AM MRN: [id number] Account #: [id number] Date of Birth: [birth year] Admit Type: Outpatient Age: 44 Room: [facility] ENDO ROOM 4 Gender: Female Note Status: Finalized Procedure:         Upper GI endoscopy Indications:       Epigastric abdominal pain Providers:         Lupita Dawn. Candace Cruise, MD Referring MD:      Forest Gleason Md, MD (Referring MD) Medicines:         Monitored Anesthesia Care Complications:     No immediate complications. Procedure:         Pre-Anesthesia Assessment:                    - Prior to the procedure, a History and Physical was                     performed, and patient medications, allergies and                     sensitivities were reviewed. The patient's tolerance of                     previous anesthesia was reviewed.                    - The risks and benefits of the procedure and the sedation                     options and risks were discussed with the patient. All                     questions were answered and informed consent was obtained.                    - After reviewing the risks and benefits, the patient was                     deemed in satisfactory condition to undergo the procedure.                    After obtaining informed consent, the endoscope was passed                     under direct vision. Throughout the procedure, the                     patient's blood pressure, pulse, and oxygen saturations                     were monitored continuously. The Olympus GIF-160 endoscope                     (S#. [id number]) was introduced through the mouth, and                     advanced to the second part of duodenum. The upper GI                     endoscopy was accomplished without difficulty. The patient                     tolerated the procedure well. Findings:      LA Grade A (one or more mucosal breaks less than  5 mm, not extending       between  tops of 2 mucosal folds) esophagitis was found at the       gastroesophageal junction. Biopsies were taken with a cold forceps for       histology.      The exam was otherwise without abnormality.      A small hiatus hernia was present.      The exam was otherwise without abnormality.      The examined duodenum was normal. Impression:        - LA Grade A reflux esophagitis. Biopsied.                    - The examination was otherwise normal.                    - Small hiatus hernia.                    - The examination was otherwise normal.                    - Normal examined duodenum. Recommendation:    - Discharge patient to home.                    - Observe patient's clinical course.                    - Await pathology results.                    - Continue present medications.                    - The findings and recommendations were discussed with the                     patient. Procedure Code(s): --- Professional ---                    [id number], Esophagogastroduodenoscopy, flexible, transoral;                     with biopsy, single or multiple Diagnosis Code(s): --- Professional ---                    K21.0, Gastro-esophageal reflux disease with esophagitis                    K44.9, Diaphragmatic hernia without obstruction or gangrene                    R10.13, Epigastric pain CPT copyright 2014 American Medical Association. All rights reserved. The codes documented in this report are preliminary and upon coder review may  be revised to meet current compliance requirements. Hulen Luster, MD 10/16/2015 11:04:36 AM This report has been signed electronically. Number of Addenda: 0 Note Initiated On: 10/16/2015 10:56 AM      [facility]

## 2015-10-16 NOTE — Transfer of Care (Signed)
Immediate Anesthesia Transfer of Care Note  Patient: Monique Greer  Procedure(s) Performed: Procedure(s): COLONOSCOPY WITH PROPOFOL (N/A) ESOPHAGOGASTRODUODENOSCOPY (EGD) WITH PROPOFOL (N/A)  Patient Location: PACU  Anesthesia Type:General  Level of Consciousness: awake, alert  and oriented  Airway & Oxygen Therapy: Patient Spontanous Breathing and Patient connected to nasal cannula oxygen  Post-op Assessment: Report given to RN and Post -op Vital signs reviewed and stable  Post vital signs: Reviewed and stable  Last Vitals:  Filed Vitals:   10/16/15 1014  BP: 152/84  Pulse: 77  Temp: 36.9 C  Resp: 14    Complications: No apparent anesthesia complications

## 2015-10-16 NOTE — Anesthesia Postprocedure Evaluation (Signed)
Anesthesia Post Note  Patient: Monique Greer  Procedure(s) Performed: Procedure(s) (LRB): COLONOSCOPY WITH PROPOFOL (N/A) ESOPHAGOGASTRODUODENOSCOPY (EGD) WITH PROPOFOL (N/A)  Patient location during evaluation: PACU Anesthesia Type: General Level of consciousness: awake Pain management: pain level controlled Vital Signs Assessment: post-procedure vital signs reviewed and stable Respiratory status: spontaneous breathing Cardiovascular status: stable Anesthetic complications: no    Last Vitals:  Filed Vitals:   10/16/15 1014  BP: 152/84  Pulse: 77  Temp: 36.9 C  Resp: 14    Last Pain: There were no vitals filed for this visit.               VAN STAVEREN,Jermanie Minshall

## 2015-10-17 LAB — SURGICAL PATHOLOGY

## 2015-10-18 ENCOUNTER — Encounter: Payer: Self-pay | Admitting: Gastroenterology

## 2015-10-20 ENCOUNTER — Encounter: Payer: Self-pay | Admitting: Medical Oncology

## 2015-10-20 ENCOUNTER — Emergency Department
Admission: EM | Admit: 2015-10-20 | Discharge: 2015-10-20 | Disposition: A | Discharge status: Home / Self Care | Payer: Medicaid Other | Attending: Emergency Medicine | Admitting: Emergency Medicine

## 2015-10-20 DIAGNOSIS — F410 Panic disorder [episodic paroxysmal anxiety] without agoraphobia: Secondary | ICD-10-CM

## 2015-10-20 DIAGNOSIS — I159 Secondary hypertension, unspecified: Secondary | ICD-10-CM | POA: Diagnosis not present

## 2015-10-20 DIAGNOSIS — F17210 Nicotine dependence, cigarettes, uncomplicated: Secondary | ICD-10-CM | POA: Insufficient documentation

## 2015-10-20 DIAGNOSIS — Z79899 Other long term (current) drug therapy: Secondary | ICD-10-CM | POA: Diagnosis not present

## 2015-10-20 DIAGNOSIS — J321 Chronic frontal sinusitis: Secondary | ICD-10-CM

## 2015-10-20 DIAGNOSIS — J0110 Acute frontal sinusitis, unspecified: Secondary | ICD-10-CM | POA: Insufficient documentation

## 2015-10-20 DIAGNOSIS — I10 Essential (primary) hypertension: Secondary | ICD-10-CM | POA: Diagnosis present

## 2015-10-20 DIAGNOSIS — Z7982 Long term (current) use of aspirin: Secondary | ICD-10-CM | POA: Diagnosis not present

## 2015-10-20 LAB — BASIC METABOLIC PANEL
Anion gap: 8 (ref 5–15)
BUN: 7 mg/dL (ref 6–20)
CHLORIDE: 109 mmol/L (ref 101–111)
CO2: 22 mmol/L (ref 22–32)
CREATININE: 0.74 mg/dL (ref 0.44–1.00)
Calcium: 9.1 mg/dL (ref 8.9–10.3)
GFR calc non Af Amer: >60 mL/min (ref >60)
Glucose, Bld: 95 mg/dL (ref 65–99)
Potassium: 4.2 mmol/L (ref 3.5–5.1)
Sodium: 139 mmol/L (ref 135–145)

## 2015-10-20 LAB — CBC
HCT: 44.1 % (ref 35.0–47.0)
HEMOGLOBIN: 15.1 g/dL (ref 12.0–16.0)
MCH: 31 pg (ref 26.0–34.0)
MCHC: 34.2 g/dL (ref 32.0–36.0)
MCV: 90.6 fL (ref 80.0–100.0)
PLATELETS: 194 10*3/uL (ref 150–440)
RBC: 4.87 MIL/uL (ref 3.80–5.20)
RDW: 13.2 % (ref 11.5–14.5)
WBC: 8.8 10*3/uL (ref 3.6–11.0)

## 2015-10-20 LAB — TROPONIN I: Troponin I: <0.03 ng/mL (ref <0.031)

## 2015-10-20 MED ORDER — AMOXICILLIN 500 MG PO TABS: 1000.0000 mg | ORAL_TABLET | Freq: Three times a day (TID) | ORAL | Status: DC

## 2015-10-20 MED ORDER — ALPRAZOLAM 0.5 MG PO TABS: 0.5000 mg | ORAL_TABLET | Freq: Three times a day (TID) | ORAL | Status: DC | PRN

## 2015-10-20 NOTE — ED Notes (Signed)
Pt was brought in via ems with reports that she has been feeling anxious over her BP being elevated. PT reports that she has been to multiple doctors and no one has addressed her BP. Pt reports chest and head pain.

## 2015-10-20 NOTE — ED Notes (Signed)
Brought in via ems  Panic attack upper epigastric pain

## 2015-10-20 NOTE — ED Provider Notes (Signed)
[facility] Emergency Department Provider Note  ____________________________________________  Time seen: Approximately 2:25 PM  I have reviewed the triage vital signs and the nursing notes.   HISTORY  Chief Complaint Hypertension    HPI Monique Greer is a 44 y.o. female with a history of anxiety, hypertension not currently taking any medications presenting with high blood pressure, panic attacks, and congestion with sinus pressure. Patient states that over the last week she has been having a nonproductive cough with frontal and maxillary pressure and a postnasal drip. She denies any fever, stiff neck, difficulty with by mouth intake. She has also had an increase in her frequency but not character of her typical panic attacks. She is currently not on any medications for this and has appointment with a psychiatrist on 12/13. She also reports that she has had several episodes of hypertension, usually associated with anxiety. She has seen Dr. Nehemiah Massed for this who feels is important for her to get her anxiety controlled prior to restarting her on antihypertensives as she has had significant episodes of hypotension in the past.Patient denies any thoughts of hurting herself, hurting anyone else, or hallucinations.   Past Medical History  Diagnosis Date  . Anxiety   . Mitral valve prolapse   . Hypertension   . CHF (congestive heart failure) ([place])   . Kidney infection   . Hyperlipidemia   . Migraine   . Syncope     most consistent with stress and vasovagal-induced  . Valvular heart disease     There are no active problems to display for this patient.   Past Surgical History  Procedure Laterality Date  . Abdominal hysterectomy    . Back surgery    . Wrist ganglion excision    . Carpal tunnel release      endoscopic  . Esophagogastroduodenoscopy    . Tubal ligation    . Colonoscopy with propofol N/A 10/16/2015    Procedure: COLONOSCOPY WITH PROPOFOL;   Surgeon: Hulen Luster, MD;  Location: [facility] ENDOSCOPY;  Service: Gastroenterology;  Laterality: N/A;  . Esophagogastroduodenoscopy (egd) with propofol N/A 10/16/2015    Procedure: ESOPHAGOGASTRODUODENOSCOPY (EGD) WITH PROPOFOL;  Surgeon: Hulen Luster, MD;  Location: [facility] ENDOSCOPY;  Service: Gastroenterology;  Laterality: N/A;    Current Outpatient Rx  Name  Route  Sig  Dispense  Refill  . acetaminophen (TYLENOL) 500 MG tablet   Oral   Take 1,000 mg by mouth every 6 (six) hours as needed for mild pain or headache.         . ALPRAZolam (XANAX) 0.5 MG tablet   Oral   Take 1 tablet (0.5 mg total) by mouth 3 (three) times daily as needed for sleep or anxiety.   10 tablet   0   . amoxicillin (AMOXIL) 500 MG tablet   Oral   Take 2 tablets (1,000 mg total) by mouth 3 (three) times daily.   60 tablet   0   . aspirin EC 81 MG tablet   Oral   Take 81 mg by mouth daily.         Marland Kitchen atorvastatin (LIPITOR) 20 MG tablet   Oral   Take 20 mg by mouth at bedtime.         Marland Kitchen HYDROcodone-acetaminophen (NORCO) 5-325 MG per tablet   Oral   Take 1 tablet by mouth every 6 (six) hours as needed for moderate pain. Patient not taking: Reported on 07/24/2015   15 tablet   0   .  ibuprofen (ADVIL,MOTRIN) 800 MG tablet   Oral   Take 1 tablet (800 mg total) by mouth every 8 (eight) hours as needed for moderate pain. Patient not taking: Reported on 07/24/2015   15 tablet   0   . naproxen sodium (ANAPROX) 220 MG tablet   Oral   Take 220 mg by mouth every 6 (six) hours as needed (for pain).          . ondansetron (ZOFRAN-ODT) 4 MG disintegrating tablet   Oral   Take 4 mg by mouth 3 (three) times daily as needed for nausea or vomiting.         . pantoprazole (PROTONIX) 40 MG tablet   Oral   Take 1 tablet (40 mg total) by mouth daily.   30 tablet   1   . pregabalin (LYRICA) 50 MG capsule   Oral   Take 50 mg by mouth 3 (three) times daily.         . sucralfate (CARAFATE) 1 G tablet   Oral    Take 1 tablet (1 g total) by mouth 4 (four) times daily.   60 tablet   0   . traMADol (ULTRAM) 50 MG tablet   Oral   Take 1 tablet (50 mg total) by mouth every 6 (six) hours as needed for pain. Patient not taking: Reported on 07/24/2015   15 tablet   0   . traMADol (ULTRAM) 50 MG tablet   Oral   Take 1 tablet (50 mg total) by mouth every 6 (six) hours as needed for moderate pain. Patient not taking: Reported on 07/24/2015   12 tablet   0     Allergies Sulfa antibiotics and Vicodin  No family history on file.  Social History Social History  Substance Use Topics  . Smoking status: Current Every Day Smoker -- 1.00 packs/day for 36 years    Types: Cigarettes  . Smokeless tobacco: Never Used  . Alcohol Use: No    Review of Systems Constitutional: No fever/chills. No lightheadedness or syncope. Eyes: No visual changes. No blurred or double vision. ENT: No sore throat. Positive for frontal and maxillary sinus pressure. Positive for postnasal drip. Negative for ear pain or throat pain. Cardiovascular: Positive chest pressure when she is having her anxiety attacks. No palpitations. Respiratory: Feels shortness of breath during her anxiety attacks..  Nonproductive cough. Gastrointestinal: No abdominal pain.  No nausea, no vomiting.  No diarrhea.  No constipation. Genitourinary: Negative for dysuria. Musculoskeletal: Negative for back pain. Skin: Negative for rash. Neurological: Negative for headaches, focal weakness or numbness.  10-point ROS otherwise negative.  ____________________________________________   PHYSICAL EXAM:  VITAL SIGNS: ED Triage Vitals  Enc Vitals Group     BP 10/20/15 1315 181/104 mmHg     Pulse Rate 10/20/15 1315 65     Resp 10/20/15 1315 18     Temp 10/20/15 1315 98.6 F (37 C)     Temp Source 10/20/15 1315 Oral     SpO2 10/20/15 1315 99 %     Weight 10/20/15 1315 185 lb (83.915 kg)     Height 10/20/15 1315 5\' 3"  (1.6 m)     Head Cir --       Peak Flow --      Pain Score 10/20/15 1315 8     Pain Loc --      Pain Edu? --      Excl. in [place]? --     Constitutional: Alert and oriented. Well  appearing and in no acute distress. Answer question appropriately. Eyes: Conjunctivae are normal.  EOMI. no discharge. EARS: TMs are clear without bulge, erythema or fluid. Canals are clear as well. Head: Atraumatic. Positive for tenderness to palpation over the frontal and maxillary sinuses. No lymphadenopathy. Nose: Positive congestion without rhinorrhea. Mouth/Throat: Mucous membranes are moist. No posterior pharyngeal erythema, uvula is midline. Palate is symmetric. Neck: No stridor.  Supple.  No JVD. Cardiovascular: Normal rate, regular rhythm. No murmurs, rubs or gallops.  Respiratory: Normal respiratory effort.  No retractions. Lungs CTAB.  No wheezes, rales or ronchi. Gastrointestinal: Soft and nontender. No distention. No peritoneal signs. Musculoskeletal: No LE edema. No palpable cords or Homans sign. Neurologic:  Normal speech and language. No gross focal neurologic deficits are appreciated.  Skin:  Skin is warm, dry and intact. No rash noted. Psychiatric: Mood and affect are normal. No signs or symptoms of anxiety at this time. Patient denies SI, HI or hallucinations. Speech and behavior are normal.  Normal judgement.  ____________________________________________   LABS (all labs ordered are listed, but only abnormal results are displayed)  Labs Reviewed  BASIC METABOLIC PANEL  CBC  TROPONIN I   ____________________________________________  EKG  ED ECG REPORT I, Eula Listen, the attending physician, personally viewed and interpreted this ECG.   Date: 10/20/2015  EKG Time: 1329  Rate: 53  Rhythm: sinus bradycardia  Axis: Normal  Intervals:none  ST&T Change: Nonspecific T-wave inversion in V1. No ST changes that are consistent with ischemia.  ____________________________________________  RADIOLOGY  No  results found.  ____________________________________________   PROCEDURES  Procedure(s) performed: None  Critical Care performed: No ____________________________________________   INITIAL IMPRESSION / ASSESSMENT AND PLAN / ED COURSE  Pertinent labs & imaging results that were available during my care of the patient were reviewed by me and considered in my medical decision making (see chart for details).  44 y.o. email with a history of anxiety presenting with increased frequency but not changed in character for panic attacks, also with frontal sinus pressure. I will plan to treat the patient for sinusitis, and then give her a few tablets of Xanax to help with acute panic attacks. She understands that she will need to see her psychiatrist for long-term psychiatric management of her panic attacks. She will continue to monitor her blood pressure and have her regular physician follow-up on this.  Today I do not see any evidence of ACS or MI, the patient has no red flag symptoms psychiatrically, I do not think she has pneumonia as her lungs are clear and she is afebrile and well-appearing. ____________________________________________  FINAL CLINICAL IMPRESSION(S) / ED DIAGNOSES  Final diagnoses:  Secondary hypertension, unspecified  Frontal sinusitis, unspecified chronicity  Panic attacks      NEW MEDICATIONS STARTED DURING THIS VISIT:  New Prescriptions   ALPRAZOLAM (XANAX) 0.5 MG TABLET    Take 1 tablet (0.5 mg total) by mouth 3 (three) times daily as needed for sleep or anxiety.   AMOXICILLIN (AMOXIL) 500 MG TABLET    Take 2 tablets (1,000 mg total) by mouth 3 (three) times daily.     Eula Listen, MD 10/20/15 1431

## 2015-10-20 NOTE — Discharge Instructions (Signed)
Please return to the emergency department if you develop thoughts of hurting herself or anyone else, hallucinations, chest pain, palpitations, fainting, fever, severe pain, or any other symptoms concerning to you.

## 2016-01-01 ENCOUNTER — Ambulatory Visit: Payer: Self-pay

## 2016-04-08 ENCOUNTER — Encounter: Payer: Self-pay | Admitting: Emergency Medicine

## 2016-04-08 ENCOUNTER — Emergency Department
Admission: EM | Admit: 2016-04-08 | Discharge: 2016-04-08 | Disposition: A | Discharge status: Home / Self Care | Payer: Medicaid Other | Attending: Emergency Medicine | Admitting: Emergency Medicine

## 2016-04-08 ENCOUNTER — Emergency Department
Admit: 2016-04-08 | Discharge: 2016-04-08 | Disposition: A | Discharge status: Home / Self Care | Payer: Medicaid Other | Attending: Emergency Medicine | Admitting: Emergency Medicine

## 2016-04-08 DIAGNOSIS — J209 Acute bronchitis, unspecified: Secondary | ICD-10-CM | POA: Diagnosis not present

## 2016-04-08 DIAGNOSIS — R05 Cough: Secondary | ICD-10-CM | POA: Diagnosis present

## 2016-04-08 DIAGNOSIS — E785 Hyperlipidemia, unspecified: Secondary | ICD-10-CM | POA: Insufficient documentation

## 2016-04-08 DIAGNOSIS — R059 Cough, unspecified: Secondary | ICD-10-CM

## 2016-04-08 DIAGNOSIS — Z79899 Other long term (current) drug therapy: Secondary | ICD-10-CM | POA: Diagnosis not present

## 2016-04-08 DIAGNOSIS — I509 Heart failure, unspecified: Secondary | ICD-10-CM | POA: Insufficient documentation

## 2016-04-08 DIAGNOSIS — Z7982 Long term (current) use of aspirin: Secondary | ICD-10-CM | POA: Insufficient documentation

## 2016-04-08 DIAGNOSIS — F1219 Cannabis abuse with unspecified cannabis-induced disorder: Secondary | ICD-10-CM | POA: Insufficient documentation

## 2016-04-08 DIAGNOSIS — I110 Hypertensive heart disease with heart failure: Secondary | ICD-10-CM | POA: Diagnosis not present

## 2016-04-08 DIAGNOSIS — F17210 Nicotine dependence, cigarettes, uncomplicated: Secondary | ICD-10-CM | POA: Diagnosis not present

## 2016-04-08 LAB — CBC WITH DIFFERENTIAL/PLATELET
BASOS ABS: 0.1 10*3/uL (ref 0–0.1)
BASOS PCT: 1 %
EOS ABS: 0.3 10*3/uL (ref 0–0.7)
EOS PCT: 4 %
HCT: 39 % (ref 35.0–47.0)
Hemoglobin: 13.6 g/dL (ref 12.0–16.0)
Lymphocytes Relative: 30 %
Lymphs Abs: 2.4 10*3/uL (ref 1.0–3.6)
MCH: 30.7 pg (ref 26.0–34.0)
MCHC: 34.8 g/dL (ref 32.0–36.0)
MCV: 88.4 fL (ref 80.0–100.0)
MONO ABS: 0.4 10*3/uL (ref 0.2–0.9)
Monocytes Relative: 6 %
Neutro Abs: 4.6 10*3/uL (ref 1.4–6.5)
Neutrophils Relative %: 59 %
PLATELETS: 205 10*3/uL (ref 150–440)
RBC: 4.41 MIL/uL (ref 3.80–5.20)
RDW: 13 % (ref 11.5–14.5)
WBC: 7.8 10*3/uL (ref 3.6–11.0)

## 2016-04-08 LAB — URINALYSIS COMPLETE WITH MICROSCOPIC (ARMC ONLY)
Bacteria, UA: NONE SEEN
Bilirubin Urine: NEGATIVE
Glucose, UA: NEGATIVE mg/dL
Hgb urine dipstick: NEGATIVE
KETONES UR: NEGATIVE mg/dL
Leukocytes, UA: NEGATIVE
Nitrite: NEGATIVE
PH: 6 (ref 5.0–8.0)
PROTEIN: NEGATIVE mg/dL
RBC / HPF: NONE SEEN RBC/hpf (ref 0–5)
SPECIFIC GRAVITY, URINE: 1.023 (ref 1.005–1.030)

## 2016-04-08 LAB — COMPREHENSIVE METABOLIC PANEL
ALBUMIN: 3.5 g/dL (ref 3.5–5.0)
ALT: 40 U/L (ref 14–54)
AST: 34 U/L (ref 15–41)
Alkaline Phosphatase: 106 U/L (ref 38–126)
Anion gap: 7 (ref 5–15)
BUN: 9 mg/dL (ref 6–20)
CHLORIDE: 105 mmol/L (ref 101–111)
CO2: 25 mmol/L (ref 22–32)
Calcium: 8.9 mg/dL (ref 8.9–10.3)
Creatinine, Ser: 0.68 mg/dL (ref 0.44–1.00)
GFR calc Af Amer: >60 mL/min (ref >60)
Glucose, Bld: 108 mg/dL — ABNORMAL HIGH (ref 65–99)
POTASSIUM: 3.6 mmol/L (ref 3.5–5.1)
SODIUM: 137 mmol/L (ref 135–145)
Total Bilirubin: 0.6 mg/dL (ref 0.3–1.2)
Total Protein: 7.4 g/dL (ref 6.5–8.1)

## 2016-04-08 MED ORDER — IPRATROPIUM-ALBUTEROL 0.5-2.5 (3) MG/3ML IN SOLN
3.0000 mL | Freq: Once | RESPIRATORY_TRACT | Status: AC
  Administered 2016-04-08: 3 mL via RESPIRATORY_TRACT
  Filled 2016-04-08: qty 3

## 2016-04-08 MED ORDER — PREDNISONE 20 MG PO TABS
40.0000 mg | ORAL_TABLET | ORAL | Status: AC
  Administered 2016-04-08: 40 mg via ORAL
  Filled 2016-04-08: qty 2

## 2016-04-08 MED ORDER — AZITHROMYCIN 250 MG PO TABS: ORAL_TABLET | ORAL | Status: DC

## 2016-04-08 MED ORDER — PREDNISONE 20 MG PO TABS
40.0000 mg | ORAL_TABLET | Freq: Every day | ORAL | Status: DC
Start: 2016-04-08 — End: 2016-08-07

## 2016-04-08 MED ORDER — ACETAMINOPHEN 500 MG PO TABS
1000.0000 mg | ORAL_TABLET | Freq: Once | ORAL | Status: AC
  Administered 2016-04-08: 1000 mg via ORAL
  Filled 2016-04-08: qty 2

## 2016-04-08 MED ORDER — ALBUTEROL SULFATE HFA 108 (90 BASE) MCG/ACT IN AERS: 2.0000 | INHALATION_SPRAY | RESPIRATORY_TRACT | Status: DC | PRN

## 2016-04-08 NOTE — Discharge Instructions (Signed)
Acute Bronchitis °Bronchitis is inflammation of the airways that extend from the windpipe into the lungs (bronchi). The inflammation often causes mucus to develop. This leads to a cough, which is the most common symptom of bronchitis.  °In acute bronchitis, the condition usually develops suddenly and goes away over time, usually in a couple weeks. Smoking, allergies, and asthma can make bronchitis worse. Repeated episodes of bronchitis may cause further lung problems.  °CAUSES °Acute bronchitis is most often caused by the same virus that causes a cold. The virus can spread from person to person (contagious) through coughing, sneezing, and touching contaminated objects. °SIGNS AND SYMPTOMS  °· Cough.   °· Fever.   °· Coughing up mucus.   °· Body aches.   °· Chest congestion.   °· Chills.   °· Shortness of breath.   °· Sore throat.   °DIAGNOSIS  °Acute bronchitis is usually diagnosed through a physical exam. Your health care provider will also ask you questions about your medical history. Tests, such as chest X-rays, are sometimes done to rule out other conditions.  °TREATMENT  °Acute bronchitis usually goes away in a couple weeks. Oftentimes, no medical treatment is necessary. Medicines are sometimes given for relief of fever or cough. Antibiotic medicines are usually not needed but may be prescribed in certain situations. In some cases, an inhaler may be recommended to help reduce shortness of breath and control the cough. A cool mist vaporizer may also be used to help thin bronchial secretions and make it easier to clear the chest.  °HOME CARE INSTRUCTIONS °· Get plenty of rest.   °· Drink enough fluids to keep your urine clear or pale yellow (unless you have a medical condition that requires fluid restriction). Increasing fluids may help thin your respiratory secretions (sputum) and reduce chest congestion, and it will prevent dehydration.   °· Take medicines only as directed by your health care provider. °· If  you were prescribed an antibiotic medicine, finish it all even if you start to feel better. °· Avoid smoking and secondhand smoke. Exposure to cigarette smoke or irritating chemicals will make bronchitis worse. If you are a smoker, consider using nicotine gum or skin patches to help control withdrawal symptoms. Quitting smoking will help your lungs heal faster.   °· Reduce the chances of another bout of acute bronchitis by washing your hands frequently, avoiding people with cold symptoms, and trying not to touch your hands to your mouth, nose, or eyes.   °· Keep all follow-up visits as directed by your health care provider.   °SEEK MEDICAL CARE IF: °Your symptoms do not improve after 1 week of treatment.  °SEEK IMMEDIATE MEDICAL CARE IF: °· You develop an increased fever or chills.   °· You have chest pain.   °· You have severe shortness of breath. °· You have bloody sputum.   °· You develop dehydration. °· You faint or repeatedly feel like you are going to pass out. °· You develop repeated vomiting. °· You develop a severe headache. °MAKE SURE YOU:  °· Understand these instructions. °· Will watch your condition. °· Will get help right away if you are not doing well or get worse. °  °This information is not intended to replace advice given to you by your health care provider. Make sure you discuss any questions you have with your health care provider. °  °Document Released: 12/12/2004 Document Revised: 11/25/2014 Document Reviewed: 04/27/2013 °Elsevier Interactive Patient Education ©2016 [employer] ° °Cough, Adult °Coughing is a reflex that clears your throat and your airways. Coughing helps to heal and   protect your lungs. It is normal to cough occasionally, but a cough that happens with other symptoms or lasts a long time may be a sign of a condition that needs treatment. A cough may last only 2-3 weeks (acute), or it may last longer than 8 weeks (chronic). °CAUSES °Coughing is commonly caused by: °· Breathing  in substances that irritate your lungs. °· A viral or bacterial respiratory infection. °· Allergies. °· Asthma. °· Postnasal drip. °· Smoking. °· Acid backing up from the stomach into the esophagus (gastroesophageal reflux). °· Certain medicines. °· Chronic lung problems, including COPD (or rarely, lung cancer). °· Other medical conditions such as heart failure. °HOME CARE INSTRUCTIONS  °Pay attention to any changes in your symptoms. Take these actions to help with your discomfort: °· Take medicines only as told by your health care provider. °¨ If you were prescribed an antibiotic medicine, take it as told by your health care provider. Do not stop taking the antibiotic even if you start to feel better. °¨ Talk with your health care provider before you take a cough suppressant medicine. °· Drink enough fluid to keep your urine clear or pale yellow. °· If the air is dry, use a cold steam vaporizer or humidifier in your bedroom or your home to help loosen secretions. °· Avoid anything that causes you to cough at work or at home. °· If your cough is worse at night, try sleeping in a semi-upright position. °· Avoid cigarette smoke. If you smoke, quit smoking. If you need help quitting, ask your health care provider. °· Avoid caffeine. °· Avoid alcohol. °· Rest as needed. °SEEK MEDICAL CARE IF:  °· You have new symptoms. °· You cough up pus. °· Your cough does not get better after 2-3 weeks, or your cough gets worse. °· You cannot control your cough with suppressant medicines and you are losing sleep. °· You develop pain that is getting worse or pain that is not controlled with pain medicines. °· You have a fever. °· You have unexplained weight loss. °· You have night sweats. °SEEK IMMEDIATE MEDICAL CARE IF: °· You cough up blood. °· You have difficulty breathing. °· Your heartbeat is very fast. °  °This information is not intended to replace advice given to you by your health care provider. Make sure you discuss any  questions you have with your health care provider. °  °Document Released: 05/03/2011 Document Revised: 07/26/2015 Document Reviewed: 01/11/2015 °Elsevier Interactive Patient Education ©2016 [employer] ° °

## 2016-04-08 NOTE — ED Notes (Signed)
Patient presents to ED with c/o cold symptoms x 1 week.

## 2016-04-08 NOTE — ED Provider Notes (Signed)
[facility] Emergency Department Provider Note  ____________________________________________  Time seen: 1:20 PM  I have reviewed the triage vital signs and the nursing notes.   HISTORY  Chief Complaint Cough    HPI Monique Greer is a 46 y.o. female who complains of shortness of breath today. She notes that she's had upper respiratory symptoms over the past week including rhinorrhea, sore throat, nonproductive cough, which now over the last 2 days has become a slightly productive cough with shortness of breath. She also reports that 2-3 days ago she had an episode where she had sweats and chills at night but no fever. Denies chest pain. Denies earaches dizziness syncope or other complaints.Has a history of GERD, hiatal hernia, mitral valve prolapse, hypertension.     Past Medical History  Diagnosis Date  . Anxiety   . Mitral valve prolapse   . Hypertension   . CHF (congestive heart failure) ([place])   . Kidney infection   . Hyperlipidemia   . Migraine   . Syncope     most consistent with stress and vasovagal-induced  . Valvular heart disease      There are no active problems to display for this patient.    Past Surgical History  Procedure Laterality Date  . Abdominal hysterectomy    . Back surgery    . Wrist ganglion excision    . Carpal tunnel release      endoscopic  . Esophagogastroduodenoscopy    . Tubal ligation    . Colonoscopy with propofol N/A 10/16/2015    Procedure: COLONOSCOPY WITH PROPOFOL;  Surgeon: Hulen Luster, MD;  Location: [facility] ENDOSCOPY;  Service: Gastroenterology;  Laterality: N/A;  . Esophagogastroduodenoscopy (egd) with propofol N/A 10/16/2015    Procedure: ESOPHAGOGASTRODUODENOSCOPY (EGD) WITH PROPOFOL;  Surgeon: Hulen Luster, MD;  Location: [facility] ENDOSCOPY;  Service: Gastroenterology;  Laterality: N/A;     Current Outpatient Rx  Name  Route  Sig  Dispense  Refill  . acetaminophen (TYLENOL) 500 MG tablet   Oral   Take  1,000 mg by mouth every 6 (six) hours as needed for mild pain or headache.         . albuterol (PROVENTIL HFA) 108 (90 Base) MCG/ACT inhaler   Inhalation   Inhale 2 puffs into the lungs every 4 (four) hours as needed for wheezing or shortness of breath.   1 Inhaler   0   . ALPRAZolam (XANAX) 0.5 MG tablet   Oral   Take 1 tablet (0.5 mg total) by mouth 3 (three) times daily as needed for sleep or anxiety.   10 tablet   0   . amoxicillin (AMOXIL) 500 MG tablet   Oral   Take 2 tablets (1,000 mg total) by mouth 3 (three) times daily.   60 tablet   0   . aspirin EC 81 MG tablet   Oral   Take 81 mg by mouth daily.         Marland Kitchen atorvastatin (LIPITOR) 20 MG tablet   Oral   Take 20 mg by mouth at bedtime.         Marland Kitchen azithromycin (ZITHROMAX Z-PAK) 250 MG tablet      Take 2 tablets (500 mg) on  Day 1,  followed by 1 tablet (250 mg) once daily on Days 2 through 5.   6 each   0   . HYDROcodone-acetaminophen (NORCO) 5-325 MG per tablet   Oral   Take 1 tablet by mouth every 6 (  six) hours as needed for moderate pain. Patient not taking: Reported on 07/24/2015   15 tablet   0   . ibuprofen (ADVIL,MOTRIN) 800 MG tablet   Oral   Take 1 tablet (800 mg total) by mouth every 8 (eight) hours as needed for moderate pain. Patient not taking: Reported on 07/24/2015   15 tablet   0   . naproxen sodium (ANAPROX) 220 MG tablet   Oral   Take 220 mg by mouth every 6 (six) hours as needed (for pain).          . ondansetron (ZOFRAN-ODT) 4 MG disintegrating tablet   Oral   Take 4 mg by mouth 3 (three) times daily as needed for nausea or vomiting.         . pantoprazole (PROTONIX) 40 MG tablet   Oral   Take 1 tablet (40 mg total) by mouth daily.   30 tablet   1   . predniSONE (DELTASONE) 20 MG tablet   Oral   Take 2 tablets (40 mg total) by mouth daily.   8 tablet   0   . pregabalin (LYRICA) 50 MG capsule   Oral   Take 50 mg by mouth 3 (three) times daily.         .  sucralfate (CARAFATE) 1 G tablet   Oral   Take 1 tablet (1 g total) by mouth 4 (four) times daily.   60 tablet   0   . traMADol (ULTRAM) 50 MG tablet   Oral   Take 1 tablet (50 mg total) by mouth every 6 (six) hours as needed for pain. Patient not taking: Reported on 07/24/2015   15 tablet   0   . traMADol (ULTRAM) 50 MG tablet   Oral   Take 1 tablet (50 mg total) by mouth every 6 (six) hours as needed for moderate pain. Patient not taking: Reported on 07/24/2015   12 tablet   0      Allergies Sulfa antibiotics and Vicodin   No family history on file.  Social History Social History  Substance Use Topics  . Smoking status: Current Every Day Smoker -- 1.00 packs/day for 36 years    Types: Cigarettes  . Smokeless tobacco: Never Used  . Alcohol Use: No    Review of Systems  Constitutional:   No fever or chills.  Eyes:   No vision changes.  ENT:   Positive sore throat and rhinorrhea. Cardiovascular:   No chest pain. Respiratory:   Positive shortness of breath and productive cough. Gastrointestinal:   Negative for abdominal pain, vomiting and diarrhea.  Genitourinary:   Negative for dysuria or difficulty urinating. Musculoskeletal:   Negative for focal pain or swelling Neurological:   Negative for headaches 10-point ROS otherwise negative.  ____________________________________________   PHYSICAL EXAM:  VITAL SIGNS: ED Triage Vitals  Enc Vitals Group     BP 04/08/16 0947 181/88 mmHg     Pulse Rate 04/08/16 0947 62     Resp 04/08/16 0947 20     Temp 04/08/16 0947 98.1 F (36.7 C)     Temp Source 04/08/16 0947 Oral     SpO2 04/08/16 0947 98 %     Weight 04/08/16 0947 190 lb (86.183 kg)     Height 04/08/16 0947 5\' 3"  (1.6 m)     Head Cir --      Peak Flow --      Pain Score 04/08/16 1327 8     Pain  Loc --      Pain Edu? --      Excl. in [place]? --     Vital signs reviewed, nursing assessments reviewed.   Constitutional:   Alert and oriented. Well appearing  and in no distress. Eyes:   No scleral icterus. No conjunctival pallor. PERRL. EOMI.  No nystagmus. ENT   Head:   Normocephalic and atraumatic.   Nose:   No congestion/rhinnorhea. No septal hematoma   Mouth/Throat:   MMM, Mild pharyngeal erythema. No peritonsillar mass.    Neck:   No stridor. No SubQ emphysema. No meningismus. Hematological/Lymphatic/Immunilogical:   Mild cervical lymphadenopathy on the left. Cardiovascular:   RRR. Symmetric bilateral radial and DP pulses.  No murmurs.  Respiratory:   Normal respiratory effort without tachypnea nor retractions. No crackles. Diffuse expiratory wheezing. Normal expiratory phase Gastrointestinal:   Soft and nontender. Non distended. There is no CVA tenderness.  No rebound, rigidity, or guarding. Genitourinary:   deferred Musculoskeletal:   Nontender with normal range of motion in all extremities. No joint effusions.  No lower extremity tenderness.  No edema. Neurologic:   Normal speech and language.  CN 2-10 normal. Motor grossly intact. No gross focal neurologic deficits are appreciated.  Skin:    Skin is warm, dry and intact. No rash noted.  No petechiae, purpura, or bullae.  ____________________________________________    LABS (pertinent positives/negatives) (all labs ordered are listed, but only abnormal results are displayed) Labs Reviewed  COMPREHENSIVE METABOLIC PANEL - Abnormal; Notable for the following:    Glucose, Bld 108 (*)    All other components within normal limits  URINALYSIS COMPLETEWITH MICROSCOPIC ([facility] ONLY) - Abnormal; Notable for the following:    Color, Urine YELLOW (*)    APPearance CLEAR (*)    Squamous Epithelial / LPF 0-5 (*)    All other components within normal limits  CBC WITH DIFFERENTIAL/PLATELET   ____________________________________________   EKG  Interpreted by me Sinus rhythm rate of 70, normal axis. Short PR interval at 102 ms. Poor R-wave progression in anterior precordial  leads. Normal ST segments and T waves.  ____________________________________________    RADIOLOGY  Chest x-ray unremarkable  ____________________________________________   PROCEDURES   ____________________________________________   INITIAL IMPRESSION / ASSESSMENT AND PLAN / ED COURSE  Pertinent labs & imaging results that were available during my care of the patient were reviewed by me and considered in my medical decision making (see chart for details).  Patient well appearing no acute distress. Presents with symptoms consistent with URI that then progressed to acute bronchitis/atypical pneumonia. Labs vitals and clinical exam are all reassuring. Start the patient on albuterol prednisone and azithromycin since his symptoms have been going on for a week and worsening, follow up closely with primary care.Considering the patient's symptoms, medical history, and physical examination today, I have low suspicion for ACS, PE, TAD, pneumothorax, carditis, mediastinitis, pneumonia, CHF, or sepsis.       ____________________________________________   FINAL CLINICAL IMPRESSION(S) / ED DIAGNOSES  Final diagnoses:  Acute bronchitis, unspecified organism       Portions of this note were generated with dragon dictation software. Dictation errors may occur despite best attempts at proofreading.   Carrie Mew, MD 04/08/16 1401

## 2016-07-23 ENCOUNTER — Encounter: Payer: Self-pay | Admitting: Emergency Medicine

## 2016-07-23 ENCOUNTER — Emergency Department
Admission: EM | Admit: 2016-07-23 | Discharge: 2016-07-23 | Disposition: A | Discharge status: Home / Self Care | Payer: Medicaid Other | Attending: Emergency Medicine | Admitting: Emergency Medicine

## 2016-07-23 DIAGNOSIS — Z79899 Other long term (current) drug therapy: Secondary | ICD-10-CM | POA: Insufficient documentation

## 2016-07-23 DIAGNOSIS — F419 Anxiety disorder, unspecified: Secondary | ICD-10-CM

## 2016-07-23 DIAGNOSIS — Z7982 Long term (current) use of aspirin: Secondary | ICD-10-CM | POA: Diagnosis not present

## 2016-07-23 DIAGNOSIS — F17210 Nicotine dependence, cigarettes, uncomplicated: Secondary | ICD-10-CM | POA: Insufficient documentation

## 2016-07-23 DIAGNOSIS — I509 Heart failure, unspecified: Secondary | ICD-10-CM | POA: Insufficient documentation

## 2016-07-23 DIAGNOSIS — F4310 Post-traumatic stress disorder, unspecified: Secondary | ICD-10-CM | POA: Insufficient documentation

## 2016-07-23 DIAGNOSIS — I110 Hypertensive heart disease with heart failure: Secondary | ICD-10-CM | POA: Insufficient documentation

## 2016-07-23 LAB — URINALYSIS COMPLETE WITH MICROSCOPIC (ARMC ONLY)
BILIRUBIN URINE: NEGATIVE
Bacteria, UA: NONE SEEN
Glucose, UA: NEGATIVE mg/dL
HGB URINE DIPSTICK: NEGATIVE
Ketones, ur: NEGATIVE mg/dL
LEUKOCYTES UA: NEGATIVE
NITRITE: NEGATIVE
PH: 5 (ref 5.0–8.0)
Protein, ur: NEGATIVE mg/dL
SPECIFIC GRAVITY, URINE: 1.023 (ref 1.005–1.030)

## 2016-07-23 LAB — COMPREHENSIVE METABOLIC PANEL
ALT: 18 U/L (ref 14–54)
AST: 17 U/L (ref 15–41)
Albumin: 4.2 g/dL (ref 3.5–5.0)
Alkaline Phosphatase: 62 U/L (ref 38–126)
Anion gap: 4 — ABNORMAL LOW (ref 5–15)
BUN: 11 mg/dL (ref 6–20)
CHLORIDE: 106 mmol/L (ref 101–111)
CO2: 25 mmol/L (ref 22–32)
CREATININE: 0.65 mg/dL (ref 0.44–1.00)
Calcium: 9.1 mg/dL (ref 8.9–10.3)
GFR calc non Af Amer: >60 mL/min (ref >60)
Glucose, Bld: 95 mg/dL (ref 65–99)
POTASSIUM: 3.9 mmol/L (ref 3.5–5.1)
SODIUM: 135 mmol/L (ref 135–145)
Total Bilirubin: 0.4 mg/dL (ref 0.3–1.2)
Total Protein: 7.2 g/dL (ref 6.5–8.1)

## 2016-07-23 LAB — CBC WITH DIFFERENTIAL/PLATELET
BASOS ABS: 0.1 10*3/uL (ref 0–0.1)
Basophils Relative: 1 %
EOS ABS: 0.2 10*3/uL (ref 0–0.7)
EOS PCT: 2 %
HCT: 44 % (ref 35.0–47.0)
Hemoglobin: 15.5 g/dL (ref 12.0–16.0)
LYMPHS ABS: 3.6 10*3/uL (ref 1.0–3.6)
Lymphocytes Relative: 38 %
MCH: 31.3 pg (ref 26.0–34.0)
MCHC: 35.2 g/dL (ref 32.0–36.0)
MCV: 88.8 fL (ref 80.0–100.0)
Monocytes Absolute: 0.5 10*3/uL (ref 0.2–0.9)
Monocytes Relative: 5 %
Neutro Abs: 5.2 10*3/uL (ref 1.4–6.5)
Neutrophils Relative %: 54 %
PLATELETS: 164 10*3/uL (ref 150–440)
RBC: 4.96 MIL/uL (ref 3.80–5.20)
RDW: 13.3 % (ref 11.5–14.5)
WBC: 9.6 10*3/uL (ref 3.6–11.0)

## 2016-07-23 LAB — URINE DRUG SCREEN, QUALITATIVE (ARMC ONLY)
Amphetamines, Ur Screen: NOT DETECTED
BARBITURATES, UR SCREEN: NOT DETECTED
BENZODIAZEPINE, UR SCRN: NOT DETECTED
CANNABINOID 50 NG, UR ~~LOC~~: POSITIVE — AB
Cocaine Metabolite,Ur ~~LOC~~: NOT DETECTED
MDMA (Ecstasy)Ur Screen: NOT DETECTED
METHADONE SCREEN, URINE: NOT DETECTED
OPIATE, UR SCREEN: NOT DETECTED
PHENCYCLIDINE (PCP) UR S: NOT DETECTED
Tricyclic, Ur Screen: NOT DETECTED

## 2016-07-23 LAB — ACETAMINOPHEN LEVEL: Acetaminophen (Tylenol), Serum: <10 ug/mL — ABNORMAL LOW (ref 10–30)

## 2016-07-23 LAB — SALICYLATE LEVEL: Salicylate Lvl: <4 mg/dL (ref 2.8–30.0)

## 2016-07-23 MED ORDER — DIAZEPAM 5 MG PO TABS
10.0000 mg | ORAL_TABLET | Freq: Once | ORAL | Status: AC
  Administered 2016-07-23: 10 mg via ORAL
  Filled 2016-07-23: qty 2

## 2016-07-23 MED ORDER — LORAZEPAM 1 MG PO TABS
1.0000 mg | ORAL_TABLET | Freq: Two times a day (BID) | ORAL | 0 refills | Status: DC
Start: 2016-07-23 — End: 2017-02-05

## 2016-07-23 NOTE — ED Provider Notes (Signed)
[facility] Emergency Department Provider Note        Time seen: ----------------------------------------- 1:03 PM on 07/23/2016 -----------------------------------------    I have reviewed the triage vital signs and the nursing notes.   HISTORY  Chief Complaint Anxiety    HPI Monique Greer is a 45 y.o. female who presents to ER with complaints of PTSD and anxiety. Patient denies SI, reports she had taken a few pills last week but does not want to kill herself. Patient states she's not sleeping well, has been very depressed and anxious since 03-31-23 when her mother died. Patient reports her mother was her best friend. She denies any other complaints at this time.   Past Medical History:  Diagnosis Date  . Anxiety   . CHF (congestive heart failure) ([place])   . Hyperlipidemia   . Hypertension   . Kidney infection   . Migraine   . Mitral valve prolapse   . Syncope    most consistent with stress and vasovagal-induced  . Valvular heart disease     There are no active problems to display for this patient.   Past Surgical History:  Procedure Laterality Date  . ABDOMINAL HYSTERECTOMY    . BACK SURGERY    . CARPAL TUNNEL RELEASE     endoscopic  . COLONOSCOPY WITH PROPOFOL N/A 10/16/2015   Procedure: COLONOSCOPY WITH PROPOFOL;  Surgeon: Hulen Luster, MD;  Location: [facility] ENDOSCOPY;  Service: Gastroenterology;  Laterality: N/A;  . ESOPHAGOGASTRODUODENOSCOPY    . ESOPHAGOGASTRODUODENOSCOPY (EGD) WITH PROPOFOL N/A 10/16/2015   Procedure: ESOPHAGOGASTRODUODENOSCOPY (EGD) WITH PROPOFOL;  Surgeon: Hulen Luster, MD;  Location: [facility] ENDOSCOPY;  Service: Gastroenterology;  Laterality: N/A;  . TUBAL LIGATION    . WRIST GANGLION EXCISION      Allergies Sulfa antibiotics and Vicodin [hydrocodone-acetaminophen]  Social History Social History  Substance Use Topics  . Smoking status: Current Every Day Smoker    Packs/day: 1.00    Years: 36.00    Types: Cigarettes   . Smokeless tobacco: Never Used  . Alcohol use No    Review of Systems Constitutional: Negative for fever. Cardiovascular: Negative for chest pain. Respiratory: Negative for shortness of breath. Gastrointestinal: Negative for abdominal pain, vomiting and diarrhea. Genitourinary: Negative for dysuria. Musculoskeletal: Negative for back pain. Skin: Negative for rash. Neurological: Negative for headaches, focal weakness or numbness. Psychiatric: Negative for suicidal or homicidal ideation.  10-point ROS otherwise negative.  ____________________________________________   PHYSICAL EXAM:  VITAL SIGNS: ED Triage Vitals  Enc Vitals Group     BP 07/23/16 1231 (!) 176/84     Pulse Rate 07/23/16 1231 74     Resp 07/23/16 1231 16     Temp 07/23/16 1231 98.1 F (36.7 C)     Temp Source 07/23/16 1231 Oral     SpO2 07/23/16 1231 98 %     Weight 07/23/16 1232 180 lb (81.6 kg)     Height 07/23/16 1232 5\' 3"  (1.6 m)     Head Circumference --      Peak Flow --      Pain Score 07/23/16 1232 7     Pain Loc --      Pain Edu? --      Excl. in [place]? --    Constitutional: Alert and oriented.Tearful Eyes: Conjunctivae are normal. PERRL. Normal extraocular movements. ENT   Head: Normocephalic and atraumatic.   Nose: No congestion/rhinnorhea.   Mouth/Throat: Mucous membranes are moist.   Neck: No stridor. Cardiovascular: Normal  rate, regular rhythm. No murmurs, rubs, or gallops. Respiratory: Normal respiratory effort without tachypnea nor retractions. Breath sounds are clear and equal bilaterally. No wheezes/rales/rhonchi. Gastrointestinal: Soft and nontender. Normal bowel sounds Musculoskeletal: Nontender with normal range of motion in all extremities. No lower extremity tenderness nor edema. Neurologic:  Normal speech and language. No gross focal neurologic deficits are appreciated.  Skin:  Skin is warm, dry and intact. No rash noted. Psychiatric: Depressed mood and  affect ____________________________________________  ED COURSE:  Pertinent labs & imaging results that were available during my care of the patient were reviewed by me and considered in my medical decision making (see chart for details). Clinical Course  Patient presents to ER with anxiety and possibly PTSD. We will assess with basic labs, she'll receive oral Valium. She is likely stable for outpatient follow-up.  Procedures ____________________________________________   LABS (pertinent positives/negatives)  Labs Reviewed  COMPREHENSIVE METABOLIC PANEL - Abnormal; Notable for the following:       Result Value   Anion gap 4 (*)    All other components within normal limits  URINALYSIS COMPLETEWITH MICROSCOPIC ([facility] ONLY) - Abnormal; Notable for the following:    Color, Urine YELLOW (*)    APPearance CLEAR (*)    Squamous Epithelial / LPF 0-5 (*)    All other components within normal limits  URINE DRUG SCREEN, QUALITATIVE ([facility] ONLY) - Abnormal; Notable for the following:    Cannabinoid 50 Ng, Ur [place] POSITIVE (*)    All other components within normal limits  ACETAMINOPHEN LEVEL - Abnormal; Notable for the following:    Acetaminophen (Tylenol), Serum <10 (*)    All other components within normal limits  CBC WITH DIFFERENTIAL/PLATELET  SALICYLATE LEVEL  ____________________________________________  FINAL ASSESSMENT AND PLAN  Anxiety, PTSD  Plan: Patient with labs as dictated above. Patient is in no acute distress, currently feeling better after Valium here. I will prescribe a short supply of Ativan. She is given a follow-up with [facility] health services. She is stable for discharge.   Earleen Newport, MD   Note: This dictation was prepared with Dragon dictation. Any transcriptional errors that result from this process are unintentional    Earleen Newport, MD 07/23/16 1459

## 2016-07-23 NOTE — ED Triage Notes (Signed)
Pt reports PTSD and anxiety. Pt denies SI at present, reports she was last week.

## 2016-08-07 ENCOUNTER — Emergency Department: Payer: Medicaid Other

## 2016-08-07 ENCOUNTER — Emergency Department
Admission: EM | Admit: 2016-08-07 | Discharge: 2016-08-08 | Disposition: A | Discharge status: Home / Self Care | Payer: Medicaid Other | Attending: Emergency Medicine | Admitting: Emergency Medicine

## 2016-08-07 ENCOUNTER — Encounter: Payer: Self-pay | Admitting: *Deleted

## 2016-08-07 DIAGNOSIS — F29 Unspecified psychosis not due to a substance or known physiological condition: Secondary | ICD-10-CM | POA: Diagnosis not present

## 2016-08-07 DIAGNOSIS — R0789 Other chest pain: Secondary | ICD-10-CM | POA: Insufficient documentation

## 2016-08-07 DIAGNOSIS — I110 Hypertensive heart disease with heart failure: Secondary | ICD-10-CM | POA: Insufficient documentation

## 2016-08-07 DIAGNOSIS — Z79899 Other long term (current) drug therapy: Secondary | ICD-10-CM | POA: Diagnosis not present

## 2016-08-07 DIAGNOSIS — F3110 Bipolar disorder, current episode manic without psychotic features, unspecified: Secondary | ICD-10-CM | POA: Diagnosis not present

## 2016-08-07 DIAGNOSIS — R1013 Epigastric pain: Secondary | ICD-10-CM | POA: Insufficient documentation

## 2016-08-07 DIAGNOSIS — I509 Heart failure, unspecified: Secondary | ICD-10-CM | POA: Insufficient documentation

## 2016-08-07 DIAGNOSIS — F17210 Nicotine dependence, cigarettes, uncomplicated: Secondary | ICD-10-CM | POA: Diagnosis not present

## 2016-08-07 DIAGNOSIS — R079 Chest pain, unspecified: Secondary | ICD-10-CM

## 2016-08-07 LAB — TROPONIN I: Troponin I: <0.03 ng/mL (ref <0.03)

## 2016-08-07 LAB — COMPREHENSIVE METABOLIC PANEL
ALT: 23 U/L (ref 14–54)
AST: 16 U/L (ref 15–41)
Albumin: 4.2 g/dL (ref 3.5–5.0)
Alkaline Phosphatase: 70 U/L (ref 38–126)
Anion gap: 6 (ref 5–15)
BILIRUBIN TOTAL: 0.3 mg/dL (ref 0.3–1.2)
BUN: 9 mg/dL (ref 6–20)
CALCIUM: 9.2 mg/dL (ref 8.9–10.3)
CHLORIDE: 108 mmol/L (ref 101–111)
CO2: 24 mmol/L (ref 22–32)
CREATININE: 0.76 mg/dL (ref 0.44–1.00)
Glucose, Bld: 102 mg/dL — ABNORMAL HIGH (ref 65–99)
Potassium: 3.8 mmol/L (ref 3.5–5.1)
Sodium: 138 mmol/L (ref 135–145)
TOTAL PROTEIN: 7.5 g/dL (ref 6.5–8.1)

## 2016-08-07 LAB — CBC
HCT: 45.2 % (ref 35.0–47.0)
HEMOGLOBIN: 16.1 g/dL — AB (ref 12.0–16.0)
MCH: 31.3 pg (ref 26.0–34.0)
MCHC: 35.6 g/dL (ref 32.0–36.0)
MCV: 88 fL (ref 80.0–100.0)
Platelets: 192 10*3/uL (ref 150–440)
RBC: 5.13 MIL/uL (ref 3.80–5.20)
RDW: 13.2 % (ref 11.5–14.5)
WBC: 10.4 10*3/uL (ref 3.6–11.0)

## 2016-08-07 LAB — LIPASE, BLOOD: LIPASE: 82 U/L — AB (ref 11–51)

## 2016-08-07 LAB — SALICYLATE LEVEL

## 2016-08-07 LAB — ETHANOL: Alcohol, Ethyl (B): <5 mg/dL (ref <5)

## 2016-08-07 LAB — ACETAMINOPHEN LEVEL

## 2016-08-07 MED ORDER — LORAZEPAM 2 MG/ML IJ SOLN: 2.0000 mg | Freq: Once | INTRAMUSCULAR | Status: DC

## 2016-08-07 MED ORDER — LORAZEPAM 2 MG/ML IJ SOLN
2.0000 mg | Freq: Once | INTRAMUSCULAR | Status: AC
  Administered 2016-08-07: 2 mg via INTRAVENOUS

## 2016-08-07 MED ORDER — ASPIRIN 81 MG PO CHEW
324.0000 mg | CHEWABLE_TABLET | Freq: Once | ORAL | Status: AC
  Administered 2016-08-07: 324 mg via ORAL
  Filled 2016-08-07: qty 4

## 2016-08-07 MED ORDER — LORAZEPAM 2 MG/ML IJ SOLN
INTRAMUSCULAR | Status: AC
  Administered 2016-08-07: 2 mg via INTRAVENOUS
  Filled 2016-08-07: qty 1

## 2016-08-07 MED ORDER — HALOPERIDOL LACTATE 5 MG/ML IJ SOLN
5.0000 mg | Freq: Once | INTRAMUSCULAR | Status: AC
Start: 2016-08-07 — End: 2016-08-07
  Administered 2016-08-07: 5 mg via INTRAMUSCULAR

## 2016-08-07 MED ORDER — HALOPERIDOL LACTATE 5 MG/ML IJ SOLN
INTRAMUSCULAR | Status: AC
  Administered 2016-08-07: 5 mg via INTRAMUSCULAR
  Filled 2016-08-07: qty 1

## 2016-08-07 NOTE — ED Notes (Signed)
MD at bedside. 
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
## 2016-08-07 NOTE — ED Notes (Signed)
When pt was informed that she was going to be transferred to behavioral health and changed into paper scrubs pt became angry and began cussing at staff and refusing to stay. Pt verbalized "I will smack the shit out of my daughter when I get out of here." Pt reports she only needs God to help her and not a psychiatrist. Pt is not making sense throughout this episode and is talking about the government, religion, and family abusing her and her children. Pt is tearful and reports she is having trouble breathing but no resp. Distress is noted at this time.

## 2016-08-07 NOTE — BH Assessment (Signed)
Assessment Note  Monique Greer is an 45 y.o. female. Pt states she initially presented to the ED because "my stomach and chest started hurting". Throughout TTS assessment, pt would have some thought blocking behaviors. Pt was tangential in speech and her thought patterns were irrelevant and bizzare. Monique Greer states she "took too much Ativan (15 pills) 2 weeks ago" . When TTS asked pt what caused her to take 15 Ativan pills pt states "that's priviledged information" when asked again she stated "I just was trying to get away from what I was feeling". Pt reports past suicide attempt when she was 45 yo - pt states she overdosed on pills. When this assessor asked her where she currently lives she states "I'm in transition". Pt constantly asked TTS staff if she was going to have her sent "downstairs". Pt then notice security standing outside of her room and stated "I know those are guards outside my room and I know they take people downstairs". Pt also reported her daughter died in 03-09-1992 and "her brain was donated to science". Pt described her anxiety as "feeling like a volcano getting ready to rupture ... Chest pains and headaches". When asked if she had any feelings of hopelessness, pt stated "God's trying to give me hope".  Pt asked this Probation officer if she believed in [place] - pt then became overly obsessive with the use of religious connotations. Pt made several comments such as "I'm going in the lambs book of life" ... "My doctor in heaven said no locked doors" ... "God woke me out of my dream" ... "Send me a preacher" ... "You can shoot me in my fucking head and Jesus will come and get me". Pt also yelled at staff to "get my aunt Monique Greer and Monique Greer out of my face" ... Staff informed pt that her aunt's were not currently present in the ED.  Pt reports decreased sleep (3-4 hours) and marijuana use. Pt states she attempted to initiate services with [facility] last year but she reports "I didn't stay committed to my therapy". Pt  denied active SI/HI; however, pt would make passive SI remarks when yelling at ED staff. Pt also used abusive language with ED staff.  Diagnosis:  Unspecified Anxiety Cannabis Use Disorder, Moderate  Past Medical History:  Past Medical History:  Diagnosis Date  . Anxiety   . CHF (congestive heart failure) ([place])   . Hyperlipidemia   . Hypertension   . Kidney infection   . Migraine   . Mitral valve prolapse   . Syncope    most consistent with stress and vasovagal-induced  . Valvular heart disease     Past Surgical History:  Procedure Laterality Date  . ABDOMINAL HYSTERECTOMY    . BACK SURGERY    . CARPAL TUNNEL RELEASE     endoscopic  . COLONOSCOPY WITH PROPOFOL N/A 10/16/2015   Procedure: COLONOSCOPY WITH PROPOFOL;  Surgeon: Hulen Luster, MD;  [facility] ENDOSCOPY;  Service: Gastroenterology;  Laterality: N/A;  . ESOPHAGOGASTRODUODENOSCOPY    . ESOPHAGOGASTRODUODENOSCOPY (EGD) WITH PROPOFOL N/A 10/16/2015   Procedure: ESOPHAGOGASTRODUODENOSCOPY (EGD) WITH PROPOFOL;  Surgeon: Hulen Luster, MD;  Location: [facility] ENDOSCOPY;  Service: Gastroenterology;  Laterality: N/A;  . TUBAL LIGATION    . WRIST GANGLION EXCISION      Family History: History reviewed. No pertinent family history.  Social History:  reports that she has been smoking Cigarettes.  She has a 36.00 pack-year smoking history. She has never used smokeless tobacco. She reports that she  uses drugs, including Marijuana. She reports that she does not drink alcohol.  Additional Social History:     CIWA: CIWA-Ar BP: 135/82 Pulse Rate: 63 COWS:    Allergies:  Allergies  Allergen Reactions  . Sulfa Antibiotics Other (See Comments)    Reaction:  Stevens-Johnson syndrome   . Vicodin [Hydrocodone-Acetaminophen]     Home Medications:  (Not in a hospital admission)  OB/GYN Status:  No LMP recorded. Patient has had a hysterectomy.  General Assessment Data Location of Assessment: [facility] ED TTS Assessment: In system Is  this a Tele or Face-to-Face Assessment?: Face-to-Face Is this an Initial Assessment or a Re-assessment for this encounter?: Initial Assessment Marital status: Single Maiden name: N/A Is patient pregnant?: No Pregnancy Status: No Living Arrangements: Alone (Pt states she lives in her father's house.) Can pt return to current living arrangement?: Yes Admission Status: Involuntary Is patient capable of signing voluntary admission?: No Referral Source: Self/Family/Friend Insurance type: Adult nurse Exam ([facility]) Medical Exam completed: Yes  Crisis Care Plan Living Arrangements: Alone (Pt states she lives in her father's house.) Legal Guardian: Other: (Self) Name of Psychiatrist: [facility] (Unsure if information provided was valid) Name of Therapist: [facility]  Education Status Is patient currently in school?: No Current Grade: N/A Highest grade of school patient has completed: GED Name of school: N/A Contact person: N/A  Risk to self with the past 6 months Suicidal Ideation: No Has patient been a risk to self within the past 6 months prior to admission? : Yes (Pt states she took "15 Ativan pills") Suicidal Intent: No Has patient had any suicidal intent within the past 6 months prior to admission? : No Is patient at risk for suicide?: No Suicidal Plan?: No Has patient had any suicidal plan within the past 6 months prior to admission? : No Access to Means: No What has been your use of drugs/alcohol within the last 12 months?: Marijuana (Pt would not provide freq, amount, duration info) Previous Attempts/Gestures: Yes How many times?: 1 (Pt states when she was 45 yo she overdosed on pills) Other Self Harm Risks: Cutting (Pt reports she has cut her wrist and neck) Triggers for Past Attempts: Unknown Intentional Self Injurious Behavior: Cutting (Per pt report) Comment - Self Injurious Behavior: Cutting - wrist and neck Family Suicide History:  Unknown Recent stressful life event(s):  Myer Haff) Persecutory voices/beliefs?: Yes (Pt believes hospital staff are out to harm her) Depression: Yes Depression Symptoms: Feeling angry/irritable, Insomnia Substance abuse history and/or treatment for substance abuse?: Yes Suicide prevention information given to non-admitted patients: Yes  Risk to Others within the past 6 months Homicidal Ideation: No Does patient have any lifetime risk of violence toward others beyond the six months prior to admission? : No Thoughts of Harm to Others: No Current Homicidal Intent: No Current Homicidal Plan: No Access to Homicidal Means: No Identified Victim: N/A History of harm to others?: No Assessment of Violence: None Noted Violent Behavior Description: N/A Does patient have access to weapons?: No Criminal Charges Pending?: No Does patient have a court date: No Is patient on probation?: No  Psychosis Hallucinations: Auditory ("I just hear the birds chirping") Delusions: Persecutory, Grandiose (Religiosity)  Mental Status Report Appearance/Hygiene: Disheveled Eye Contact: Poor Motor Activity: Freedom of movement, Unremarkable Speech: Tangential Level of Consciousness: Irritable Mood: Irritable, Suspicious, Labile, Anxious (Pt was extremely mistrustful of staff) Affect: Irritable, Anxious Anxiety Level: Severe Thought Processes: Tangential, Irrelevant, Thought Blocking (Pt had some thought blocking behaviors during  assessment.) Judgement: Impaired Orientation: Person, Place, Time, Situation Obsessive Compulsive Thoughts/Behaviors: Severe (Pt was overly obsessive with use religious connotations)  Cognitive Functioning Concentration: Poor Memory: Recent Impaired, Remote Impaired (Poor memory) IQ: Average Insight: Poor Impulse Control: Poor Appetite: Good Weight Loss: 0 Weight Gain: 0 Sleep: Decreased Total Hours of Sleep: 4 Vegetative Symptoms: None  ADLScreening [facility] Assessment  Services) Patient's cognitive ability adequate to safely complete daily activities?: Yes Patient able to express need for assistance with ADLs?: Yes Independently performs ADLs?: Yes (appropriate for developmental age)  Prior Inpatient Therapy Prior Inpatient Therapy: Yes Prior Therapy Dates: [facility] Prior Therapy Facilty/Provider(s): [facility] Reason for Treatment: [facility] (Pt told this Probation officer to "go read it")  Prior Outpatient Therapy Prior Outpatient Therapy: Yes Prior Therapy Dates: [facility] (Pt unable to recall dates) Prior Therapy Facilty/Provider(s): [facility] Reason for Treatment: [facility] Does patient have an ACCT team?: No Does patient have Intensive In-House Services?  : No Does patient have [facility] services? : No Does patient have P4CC services?: No  ADL Screening (condition at time of admission) Patient's cognitive ability adequate to safely complete daily activities?: Yes Patient able to express need for assistance with ADLs?: Yes Independently performs ADLs?: Yes (appropriate for developmental age)             Regulatory affairs officer (For Healthcare) Does patient have an advance directive?: No Would patient like information on creating an advanced directive?: No - patient declined information    Additional Information 1:1 In Past 12 Months?: No CIRT Risk: Yes Elopement Risk: Yes  Child/Adolescent Assessment Running Away Risk: Denies Bed-Wetting: Denies Destruction of Property: Denies Cruelty to Animals: Denies Stealing: Denies Rebellious/Defies Authority: Denies Satanic Involvement: Denies Science writer: Denies Problems at [employer]: Denies Gang Involvement: Denies  Disposition:  Disposition Initial Assessment Completed for this Encounter: Yes Disposition of Patient: Referred to (Psych MD to see) Patient referred to: Other (Comment) (Psych MD to see)  On Site Evaluation by:   Reviewed with Physician:    Frederich Cha 08/07/2016 9:40 PM

## 2016-08-07 NOTE — ED Triage Notes (Signed)
Pt arrived to ED from home after reporting epigastic and upper abd pain that began last night and developed into centralized chest pain today. Pt reports nausea, dizziness and diaphoresis throughout the day. {t also reports having had diarrhea for the past two days. Pt tearful upon arrival and talking about increased anxiety. Pt alert and oriented x 4 and in NAD.

## 2016-08-08 DIAGNOSIS — F3110 Bipolar disorder, current episode manic without psychotic features, unspecified: Secondary | ICD-10-CM

## 2016-08-08 MED ORDER — HALOPERIDOL DECANOATE 100 MG/ML IM SOLN
50.0000 mg | Freq: Once | INTRAMUSCULAR | Status: AC
  Administered 2016-08-08: 50 mg via INTRAMUSCULAR
  Filled 2016-08-08: qty 0.5

## 2016-08-08 MED ORDER — HALOPERIDOL DECANOATE 50 MG/ML IM SOLN: 50.0000 mg | INTRAMUSCULAR | 1 refills | Status: DC

## 2016-08-08 MED ORDER — CARBAMAZEPINE 200 MG PO TABS: 200.0000 mg | ORAL_TABLET | Freq: Two times a day (BID) | ORAL | 1 refills | Status: DC

## 2016-08-08 MED ORDER — HALOPERIDOL 5 MG PO TABS: 5.0000 mg | ORAL_TABLET | Freq: Every day | ORAL | 1 refills | Status: DC

## 2016-08-08 NOTE — ED Notes (Signed)
Called pharmacy regarding needed haldol decanoate. Pharmacist must verify and then they will send it.

## 2016-08-08 NOTE — Consult Note (Signed)
[facility] Face-to-Face Psychiatry Consult   Reason for Consult:  Consult for this 45 year old woman with a past history of bipolar disorder who came into the hospital initially for somatic complaints but then revealed that she was having some disorganized thinking and mood instability. Referring Physician:  Eual Fines Patient Identification: Monique Greer MRN:  [id number] Principal Diagnosis: Bipolar disorder, current episode manic without psychotic features [facility]) Diagnosis:   Patient Active Problem List   Diagnosis Date Noted  . Bipolar disorder, current episode manic without psychotic features ([place]) [F31.10] 08/08/2016    Total Time spent with patient: 1 hour  Subjective:   [name] is a 45 y.o. female patient admitted with "I'm a little bit frustrated N".  HPI:  Patient seen chart reviewed labs reviewed. This is a 45 year old woman with a past history of bipolar disorder. She came to the hospital last night initially complaining of abdominal complaints. She tells me today that she believes that her stomach discomfort is clearly related to her anxiety. Her nerves have been frazzled and she's been feeling very anxious recently. Despite that she's been sleeping and eating okay. That she has been off of all of her psychiatric medicine probably for an entire year. Earlier in the evening when she was evaluated she was documented as being more confused with a lot of hyper religious speech. She was given some medication and that seems to of cleared up. Patient was not obviously hyper religious with me. She did make some religious comments but it wasn't anything obviously unusual. She denies any suicidal or homicidal thoughts. She denies that she is currently having hallucinations. He denies that she's been abusing alcohol or drugs. 2 weeks ago she says she took some extra Ativan in an attempt to get to sleep. Denies there was anything suicidal about it. Major life stresses include the fact that her  mother died in 04/15/23 of this year. She had been very close and dependent on her mother. Also her fianc had a heart attack around the same time.  Medical history: Patient has a history of dyslipidemia. Medically otherwise fairly stable.  Social history: Currently living in her mother's house. She has 2 of her adult children and 2 grandchildren staying with her. Apparently she does not get any kind of financial support.  Substance abuse history: Patient denies any significant past substance abuse history.  Past Psychiatric History: Patient has a history of bipolar disorder and has had prior admissions to the hospital although it's been a couple of years. Has been off of her psychiatric medicine for about a year. In the past she took Tegretol and Haldol and other antipsychotics but did better on things that she could afford. She denies that she's ever actually tried to kill her self in the past.  Risk to Self: Suicidal Ideation: No Suicidal Intent: No Is patient at risk for suicide?: No Suicidal Plan?: No Access to Means: No What has been your use of drugs/alcohol within the last 12 months?: Marijuana (Pt would not provide freq, amount, duration info) How many times?: 1 (Pt states when she was 45 yo she overdosed on pills) Other Self Harm Risks: Cutting (Pt reports she has cut her wrist and neck) Triggers for Past Attempts: Unknown Intentional Self Injurious Behavior: Cutting (Per pt report) Comment - Self Injurious Behavior: Cutting - wrist and neck Risk to Others: Homicidal Ideation: No Thoughts of Harm to Others: No Current Homicidal Intent: No Current Homicidal Plan: No Access to Homicidal Means: No Identified Victim:  N/A History of harm to others?: No Assessment of Violence: None Noted Violent Behavior Description: N/A Does patient have access to weapons?: No Criminal Charges Pending?: No Does patient have a court date: No Prior Inpatient Therapy: Prior Inpatient Therapy: Yes Prior  Therapy Dates: [facility] Prior Therapy Facilty/Provider(s): [place] Reason for Treatment: [facility] (Pt told this Probation officer to "go read it") Prior Outpatient Therapy: Prior Outpatient Therapy: Yes Prior Therapy Dates: [facility] (Pt unable to recall dates) Prior Therapy Facilty/Provider(s): [facility] Reason for Treatment: [facility] Does patient have an ACCT team?: No Does patient have Intensive In-House Services?  : No Does patient have [facility] services? : No Does patient have P4CC services?: No  Past Medical History:  Past Medical History:  Diagnosis Date  . Anxiety   . CHF (congestive heart failure) ([place])   . Hyperlipidemia   . Hypertension   . Kidney infection   . Migraine   . Mitral valve prolapse   . Syncope    most consistent with stress and vasovagal-induced  . Valvular heart disease     Past Surgical History:  Procedure Laterality Date  . ABDOMINAL HYSTERECTOMY    . BACK SURGERY    . CARPAL TUNNEL RELEASE     endoscopic  . COLONOSCOPY WITH PROPOFOL N/A 10/16/2015   Procedure: COLONOSCOPY WITH PROPOFOL;  Surgeon: Hulen Luster, MD;  Location: [facility] ENDOSCOPY;  Service: Gastroenterology;  Laterality: N/A;  . ESOPHAGOGASTRODUODENOSCOPY    . ESOPHAGOGASTRODUODENOSCOPY (EGD) WITH PROPOFOL N/A 10/16/2015   Procedure: ESOPHAGOGASTRODUODENOSCOPY (EGD) WITH PROPOFOL;  Surgeon: Hulen Luster, MD;  Location: [facility] ENDOSCOPY;  Service: Gastroenterology;  Laterality: N/A;  . TUBAL LIGATION    . WRIST GANGLION EXCISION     Family History: History reviewed. No pertinent family history. Family Psychiatric  History: She says she had an aunt with bipolar disorder in a cousin who committed suicide. Social History:  History  Alcohol Use No     History  Drug Use  . Types: Marijuana    Social History   Social History  . Marital status: Legally Separated    Spouse name: N/A  . Number of children: N/A  . Years of education: N/A   Social History Main Topics  . Smoking status: Current Every Day Smoker    Packs/day: 1.00     Years: 36.00    Types: Cigarettes  . Smokeless tobacco: Never Used  . Alcohol use No  . Drug use:     Types: Marijuana  . Sexual activity: Not Asked   Other Topics Concern  . None   Social History Narrative  . None   Additional Social History:    Allergies:   Allergies  Allergen Reactions  . Sulfa Antibiotics Other (See Comments)    Reaction:  Stevens-Johnson syndrome   . Vicodin [Hydrocodone-Acetaminophen]     Labs:  Results for orders placed or performed during the hospital encounter of 08/07/16 (from the past 48 hour(s))  CBC     Status: Abnormal   Collection Time: 08/07/16  7:14 PM  Result Value Ref Range   WBC 10.4 3.6 - 11.0 K/uL   RBC 5.13 3.80 - 5.20 MIL/uL   Hemoglobin 16.1 (H) 12.0 - 16.0 g/dL   HCT 45.2 35.0 - 47.0 %   MCV 88.0 80.0 - 100.0 fL   MCH 31.3 26.0 - 34.0 pg   MCHC 35.6 32.0 - 36.0 g/dL   RDW 13.2 11.5 - 14.5 %   Platelets 192 150 - 440 K/uL  Troponin I  Status: None   Collection Time: 08/07/16  7:14 PM  Result Value Ref Range   Troponin I <0.03 <0.03 ng/mL  Comprehensive metabolic panel     Status: Abnormal   Collection Time: 08/07/16  7:14 PM  Result Value Ref Range   Sodium 138 135 - 145 mmol/L   Potassium 3.8 3.5 - 5.1 mmol/L   Chloride 108 101 - 111 mmol/L   CO2 24 22 - 32 mmol/L   Glucose, Bld 102 (H) 65 - 99 mg/dL   BUN 9 6 - 20 mg/dL   Creatinine, Ser 0.76 0.44 - 1.00 mg/dL   Calcium 9.2 8.9 - 10.3 mg/dL   Total Protein 7.5 6.5 - 8.1 g/dL   Albumin 4.2 3.5 - 5.0 g/dL   AST 16 15 - 41 U/L   ALT 23 14 - 54 U/L   Alkaline Phosphatase 70 38 - 126 U/L   Total Bilirubin 0.3 0.3 - 1.2 mg/dL   GFR calc non Af Amer >60 >60 mL/min   GFR calc Af Amer >60 >60 mL/min    Comment: (NOTE) The eGFR has been calculated using the CKD EPI equation. This calculation has not been validated in all clinical situations. eGFR's persistently <60 mL/min signify possible Chronic Kidney Disease.    Anion gap 6 5 - 15  Lipase, blood      Status: Abnormal   Collection Time: 08/07/16  7:14 PM  Result Value Ref Range   Lipase 82 (H) 11 - 51 U/L  Troponin I     Status: None   Collection Time: 08/07/16 10:43 PM  Result Value Ref Range   Troponin I <2.42 <6.83 ng/mL  Salicylate level     Status: None   Collection Time: 08/07/16 10:43 PM  Result Value Ref Range   Salicylate Lvl <4.1 2.8 - 30.0 mg/dL  Acetaminophen level     Status: Abnormal   Collection Time: 08/07/16 10:43 PM  Result Value Ref Range   Acetaminophen (Tylenol), Serum <10 (L) 10 - 30 ug/mL    Comment:        THERAPEUTIC CONCENTRATIONS VARY SIGNIFICANTLY. A RANGE OF 10-30 ug/mL MAY BE AN EFFECTIVE CONCENTRATION FOR MANY PATIENTS. HOWEVER, SOME ARE BEST TREATED AT CONCENTRATIONS OUTSIDE THIS RANGE. ACETAMINOPHEN CONCENTRATIONS >150 ug/mL AT 4 HOURS AFTER INGESTION AND >50 ug/mL AT 12 HOURS AFTER INGESTION ARE OFTEN ASSOCIATED WITH TOXIC REACTIONS.   Ethanol     Status: None   Collection Time: 08/07/16 10:43 PM  Result Value Ref Range   Alcohol, Ethyl (B) <5 <5 mg/dL    Comment:        LOWEST DETECTABLE LIMIT FOR SERUM ALCOHOL IS 5 mg/dL FOR MEDICAL PURPOSES ONLY     No current facility-administered medications for this encounter.    Current Outpatient Prescriptions  Medication Sig Dispense Refill  . acetaminophen (TYLENOL) 500 MG tablet Take 1,000 mg by mouth every 6 (six) hours as needed for mild pain or headache.    Marland Kitchen LORazepam (ATIVAN) 1 MG tablet Take 1 tablet (1 mg total) by mouth 2 (two) times daily. 20 tablet 0  . carbamazepine (TEGRETOL) 200 MG tablet Take 1 tablet (200 mg total) by mouth 2 (two) times daily. 60 tablet 1  . haloperidol (HALDOL) 5 MG tablet Take 1 tablet (5 mg total) by mouth daily. 30 tablet 1  . haloperidol decanoate (HALDOL DECANOATE) 50 MG/ML injection Inject 1 mL (50 mg total) into the muscle every 28 (twenty-eight) days. 1 mL 1    Musculoskeletal:  Strength & Muscle Tone: within normal limits Gait & Station:  normal Patient leans: N/A  Psychiatric Specialty Exam: Physical Exam  Nursing note and vitals reviewed. Constitutional: She appears well-developed and well-nourished.  HENT:  Head: Normocephalic and atraumatic.  Eyes: Conjunctivae are normal. Pupils are equal, round, and reactive to light.  Neck: Normal range of motion.  Cardiovascular: Regular rhythm and normal heart sounds.   Respiratory: Effort normal. No respiratory distress.  GI: Soft.  Musculoskeletal: Normal range of motion.  Neurological: She is alert.  Skin: Skin is warm and dry.  Psychiatric: Her mood appears anxious. Her speech is delayed. She is slowed. She expresses impulsivity. She expresses no homicidal and no suicidal ideation. She exhibits abnormal recent memory.    Review of Systems  Constitutional: Negative.   HENT: Negative.   Eyes: Negative.   Respiratory: Negative.   Cardiovascular: Negative.   Gastrointestinal: Negative.   Musculoskeletal: Negative.   Skin: Negative.   Neurological: Negative.   Psychiatric/Behavioral: Negative for depression, hallucinations, memory loss, substance abuse and suicidal ideas. The patient is nervous/anxious and has insomnia.     Blood pressure 136/77, pulse 82, temperature 98.8 F (37.1 C), temperature source Oral, resp. rate 20, height '5\' 3"'$  (1.6 m), weight 81.6 kg (180 lb), SpO2 98 %.Body mass index is 31.89 kg/m.  General Appearance: Disheveled  Eye Contact:  Good  Speech:  Clear and Coherent  Volume:  Normal  Mood:  Anxious  Affect:  Congruent  Thought Process:  Goal Directed  Orientation:  Full (Time, Place, and Person)  Thought Content:  Logical  Suicidal Thoughts:  No  Homicidal Thoughts:  No  Memory:  Immediate;   Good Recent;   Fair Remote;   Fair  Judgement:  Fair  Insight:  Fair  Psychomotor Activity:  Decreased  Concentration:  Concentration: Fair  Recall:  [employer] of Knowledge:  Fair  Language:  Fair  Akathisia:  No  Handed:  Right  AIMS (if  indicated):     Assets:  Communication Skills Desire for Improvement Housing Resilience  ADL's:  Intact  Cognition:  WNL  Sleep:        Treatment Plan Summary: Daily contact with patient to assess and evaluate symptoms and progress in treatment, Medication management and Plan This is a 45 year old woman with a history of bipolar disorder. Although she's been off medicines for about a year and apparently had been doing reasonably well she's had a lot of stress. She is not sleeping well currently. Earlier in the evening she was apparently acting a little more disorganized and making hyper religious statements although it still not clear that there was any frank dangerousness. I offered the patient the suggestion that we restart psychiatric medicine for her in which case it would be probably safe for her to go home. She was agreeable to the plan. I ordered a Haldol decanoate injection of 50 mg and wrote prescriptions for Haldol 5 mg per day and Tegretol 200 mg twice a day which replicates previous doses she had. Patient is already known to [facility] and agrees that she will go follow-up there. Case reviewed with emergency room physician and TTS. Discontinued IVC. Patient can be discharged home.  Disposition: Patient does not meet criteria for psychiatric inpatient admission. Supportive therapy provided about ongoing stressors.  Alethia Berthold, MD 08/08/2016 3:17 PM

## 2016-08-08 NOTE — ED Notes (Signed)
Pt discharged home after verbalizing understanding of discharge instructions; nad noted. 

## 2016-08-08 NOTE — ED Notes (Signed)
Pt changed into burgundy scrubs by this RN and [place], NT. Pt became agitated yelling at this RN that she would be suing the hospital and that "only Jesus Christ can be crucified!". Pt noted to be agitated stating ODS was "a mall cop". This RN instructed patient that due to the IVC she could not leave until cleared by Dr. Weber Cooks. Pt is noted to had religious dellusions with a focus on being "crucified like Jesus".

## 2016-08-08 NOTE — ED Notes (Signed)
Pt given phone to call for a ride.  

## 2016-08-08 NOTE — ED Provider Notes (Signed)
-----------------------------------------   12:30 PM on 08/08/2016 -----------------------------------------   Blood pressure (!) 99/59, pulse 60, temperature 98.8 F (37.1 C), temperature source Oral, resp. rate 16, height 5\' 3"  (1.6 m), weight 180 lb (81.6 kg), SpO2 100 %.  The patient had no acute events since last update.  Calm and cooperative at this time.  Patient improved with meds in the emergency department. Seen and evaluated by Dr. Weber Cooks. ED chart appropriate for outpatient follow-up. She'll be following up with [facility]. Patient denies any suicidal or homicidal ideation at this time.    Orbie Pyo, MD 08/08/16 [phone]

## 2017-02-05 ENCOUNTER — Emergency Department
Admission: EM | Admit: 2017-02-05 | Discharge: 2017-02-06 | Disposition: A | Discharge status: Home / Self Care | Payer: Medicaid Other | Attending: Emergency Medicine | Admitting: Emergency Medicine

## 2017-02-05 DIAGNOSIS — G43909 Migraine, unspecified, not intractable, without status migrainosus: Secondary | ICD-10-CM

## 2017-02-05 DIAGNOSIS — R519 Headache, unspecified: Secondary | ICD-10-CM

## 2017-02-05 DIAGNOSIS — F17210 Nicotine dependence, cigarettes, uncomplicated: Secondary | ICD-10-CM | POA: Insufficient documentation

## 2017-02-05 DIAGNOSIS — I110 Hypertensive heart disease with heart failure: Secondary | ICD-10-CM | POA: Insufficient documentation

## 2017-02-05 DIAGNOSIS — R45851 Suicidal ideations: Secondary | ICD-10-CM

## 2017-02-05 DIAGNOSIS — F3130 Bipolar disorder, current episode depressed, mild or moderate severity, unspecified: Secondary | ICD-10-CM

## 2017-02-05 DIAGNOSIS — Z79899 Other long term (current) drug therapy: Secondary | ICD-10-CM | POA: Insufficient documentation

## 2017-02-05 DIAGNOSIS — R51 Headache: Secondary | ICD-10-CM | POA: Insufficient documentation

## 2017-02-05 DIAGNOSIS — F3489 Other specified persistent mood disorders: Secondary | ICD-10-CM | POA: Insufficient documentation

## 2017-02-05 DIAGNOSIS — R4586 Emotional lability: Secondary | ICD-10-CM

## 2017-02-05 DIAGNOSIS — I509 Heart failure, unspecified: Secondary | ICD-10-CM | POA: Insufficient documentation

## 2017-02-05 LAB — CBC
HCT: 44.7 % (ref 35.0–47.0)
HEMOGLOBIN: 15.5 g/dL (ref 12.0–16.0)
MCH: 30.8 pg (ref 26.0–34.0)
MCHC: 34.8 g/dL (ref 32.0–36.0)
MCV: 88.7 fL (ref 80.0–100.0)
PLATELETS: 196 10*3/uL (ref 150–440)
RBC: 5.04 MIL/uL (ref 3.80–5.20)
RDW: 13 % (ref 11.5–14.5)
WBC: 11.5 10*3/uL — AB (ref 3.6–11.0)

## 2017-02-05 LAB — COMPREHENSIVE METABOLIC PANEL
ALT: 11 U/L — ABNORMAL LOW (ref 14–54)
ANION GAP: 6 (ref 5–15)
AST: 18 U/L (ref 15–41)
Albumin: 4.1 g/dL (ref 3.5–5.0)
Alkaline Phosphatase: 61 U/L (ref 38–126)
BUN: 8 mg/dL (ref 6–20)
CO2: 24 mmol/L (ref 22–32)
Calcium: 9.2 mg/dL (ref 8.9–10.3)
Chloride: 106 mmol/L (ref 101–111)
Creatinine, Ser: 0.72 mg/dL (ref 0.44–1.00)
Glucose, Bld: 98 mg/dL (ref 65–99)
Potassium: 3.5 mmol/L (ref 3.5–5.1)
SODIUM: 136 mmol/L (ref 135–145)
TOTAL PROTEIN: 7.6 g/dL (ref 6.5–8.1)
Total Bilirubin: 0.4 mg/dL (ref 0.3–1.2)

## 2017-02-05 MED ORDER — ACETAMINOPHEN 325 MG PO TABS
650.0000 mg | ORAL_TABLET | Freq: Once | ORAL | Status: AC
  Administered 2017-02-05: 650 mg via ORAL
  Filled 2017-02-05: qty 2

## 2017-02-05 NOTE — ED Provider Notes (Signed)
[facility] Emergency Department Provider Note  ____________________________________________  Time seen: Approximately 10:27 PM  I have reviewed the triage vital signs and the nursing notes.   HISTORY  Chief Complaint Psychiatric Evaluation and Paranoid    HPI Monique Greer is a 46 y.o. female with a history of a polar disorder and schizophrenia presenting for mood swings and suicidal ideations.The patient reports for the past several weeks she has had increasingly abnormal mood swings. A few weeks ago she did have some suicidal ideations but denies that she is having these now. She denies any homicidal ideations, hallucinations, or any acute medical complaints other than a pressure headache "behind my eyes." No visual changes, numbness tingling or weakness, vomiting, fever.   Past Medical History:  Diagnosis Date  . Anxiety   . CHF (congestive heart failure) ([place])   . Hyperlipidemia   . Hypertension   . Kidney infection   . Migraine   . Mitral valve prolapse   . Syncope    most consistent with stress and vasovagal-induced  . Valvular heart disease     Patient Active Problem List   Diagnosis Date Noted  . Bipolar disorder, current episode manic without psychotic features ([place]) 08/08/2016    Past Surgical History:  Procedure Laterality Date  . ABDOMINAL HYSTERECTOMY    . BACK SURGERY    . CARPAL TUNNEL RELEASE     endoscopic  . COLONOSCOPY WITH PROPOFOL N/A 10/16/2015   Procedure: COLONOSCOPY WITH PROPOFOL;  Surgeon: Hulen Luster, MD;  Location: [facility] ENDOSCOPY;  Service: Gastroenterology;  Laterality: N/A;  . ESOPHAGOGASTRODUODENOSCOPY    . ESOPHAGOGASTRODUODENOSCOPY (EGD) WITH PROPOFOL N/A 10/16/2015   Procedure: ESOPHAGOGASTRODUODENOSCOPY (EGD) WITH PROPOFOL;  Surgeon: Hulen Luster, MD;  Location: [facility] ENDOSCOPY;  Service: Gastroenterology;  Laterality: N/A;  . TUBAL LIGATION    . WRIST GANGLION EXCISION      Current Outpatient Rx  . Order  #: [id number] Class: Historical Med  . Order #: [id number] Class: Print  . Order #: [id number] Class: Print  . Order #: [id number] Class: Print  . Order #: [id number] Class: Print    Allergies Sulfa antibiotics and Vicodin [hydrocodone-acetaminophen]  No family history on file.  Social History Social History  Substance Use Topics  . Smoking status: Current Every Day Smoker    Packs/day: 1.00    Years: 36.00    Types: Cigarettes  . Smokeless tobacco: Never Used  . Alcohol use No    Review of Systems Constitutional: No fever/chills. Eyes: No visual changes. ENT: No sore throat. No congestion or rhinorrhea. Cardiovascular: Denies chest pain. Denies palpitations. Respiratory: Denies shortness of breath.  No cough. Gastrointestinal: No abdominal pain.  No nausea, no vomiting.  No diarrhea.  No constipation. Genitourinary: Negative for dysuria. Musculoskeletal: Negative for back pain. Skin: Negative for rash. Neurological: Positive for headaches. No focal numbness, tingling or weakness.  Psychiatric:Suicidal ideations, now resolved. Positive mood swings. No HI or hallucinations.  10-point ROS otherwise negative.  ____________________________________________   PHYSICAL EXAM:  VITAL SIGNS: ED Triage Vitals  Enc Vitals Group     BP 02/05/17 1844 (!) 188/73     Pulse Rate 02/05/17 1844 81     Resp 02/05/17 1844 19     Temp 02/05/17 1844 99.1 F (37.3 C)     Temp Source 02/05/17 1844 Oral     SpO2 02/05/17 1844 99 %     Weight 02/05/17 1845 180 lb (81.6 kg)     Height 02/05/17 1845 5\' 3"  (1.6 m)  Head Circumference --      Peak Flow --      Pain Score --      Pain Loc --      Pain Edu? --      Excl. in [place]? --     Constitutional: Alert and oriented. Well appearing and in no acute distress. Answers questions appropriately. Eyes: Conjunctivae are normal.  EOMI. No scleral icterus. Head: Atraumatic. Nose: No congestion/rhinnorhea. Mouth/Throat: Mucous membranes are  moist.  Neck: No stridor.  Supple.   Cardiovascular: Normal rate, regular rhythm. No murmurs, rubs or gallops.  Respiratory: Normal respiratory effort.  No accessory muscle use or retractions. Lungs CTAB.  No wheezes, rales or ronchi. Musculoskeletal: No LE edema. . Neurologic:  A&Ox3.  Speech is clear.  Face and smile are symmetric.  EOMI.  Moves all extremities well. Skin:  Skin is warm, dry and intact. No rash noted. Psychiatric: Some mild bizarre comments but otherwise no pressured speech, no significant paranoia. The patient denies SI or HI, or hallucinations and my examination.  ____________________________________________   LABS (all labs ordered are listed, but only abnormal results are displayed)  Labs Reviewed  COMPREHENSIVE METABOLIC PANEL - Abnormal; Notable for the following:       Result Value   ALT 11 (*)    All other components within normal limits  CBC - Abnormal; Notable for the following:    WBC 11.5 (*)    All other components within normal limits  URINE DRUG SCREEN, QUALITATIVE ([facility] ONLY)  URINALYSIS, ROUTINE W REFLEX MICROSCOPIC   ____________________________________________  EKG  Not indicated ____________________________________________  RADIOLOGY  No results found.  ____________________________________________   PROCEDURES  Procedure(s) performed: None  Procedures  Critical Care performed: No ____________________________________________   INITIAL IMPRESSION / ASSESSMENT AND PLAN / ED COURSE  Pertinent labs & imaging results that were available during my care of the patient were reviewed by me and considered in my medical decision making (see chart for details).  46 y.o. female with a history of bipolar disorder and is of any of presenting with mood swings, and suicidal ideations that are now resolved. Overall, the patient is hemodynamically stable and has no acute medical complaints other than headache for which I'll treat her with  Tylenol. She is medically cleared for gastric disposition.  ____________________________________________  FINAL CLINICAL IMPRESSION(S) / ED DIAGNOSES  Final diagnoses:  None         NEW MEDICATIONS STARTED DURING THIS VISIT:  New Prescriptions   No medications on file      Eula Listen, MD 02/05/17 2338

## 2017-02-05 NOTE — ED Triage Notes (Addendum)
Pt presents voluntary to ED c/o increased mood swings with hx of schizophrenia described as "high and lows", denies hallucinations, dreams about dead people, strong nightmares, anger, and violence in the past 3-4 weeks. Denies suicidal thoughts

## 2017-02-06 DIAGNOSIS — F3131 Bipolar disorder, current episode depressed, mild: Secondary | ICD-10-CM

## 2017-02-06 DIAGNOSIS — F3130 Bipolar disorder, current episode depressed, mild or moderate severity, unspecified: Secondary | ICD-10-CM

## 2017-02-06 DIAGNOSIS — G43909 Migraine, unspecified, not intractable, without status migrainosus: Secondary | ICD-10-CM

## 2017-02-06 LAB — URINALYSIS, ROUTINE W REFLEX MICROSCOPIC
BACTERIA UA: NONE SEEN
Bilirubin Urine: NEGATIVE
GLUCOSE, UA: NEGATIVE mg/dL
HGB URINE DIPSTICK: NEGATIVE
KETONES UR: NEGATIVE mg/dL
Leukocytes, UA: NEGATIVE
NITRITE: NEGATIVE
PROTEIN: 30 mg/dL — AB
Specific Gravity, Urine: 1.029 (ref 1.005–1.030)
pH: 5 (ref 5.0–8.0)

## 2017-02-06 LAB — URINE DRUG SCREEN, QUALITATIVE (ARMC ONLY)
Amphetamines, Ur Screen: NOT DETECTED
Barbiturates, Ur Screen: NOT DETECTED
Benzodiazepine, Ur Scrn: NOT DETECTED
Cannabinoid 50 Ng, Ur ~~LOC~~: POSITIVE — AB
Cocaine Metabolite,Ur ~~LOC~~: NOT DETECTED
MDMA (ECSTASY) UR SCREEN: NOT DETECTED
METHADONE SCREEN, URINE: NOT DETECTED
OPIATE, UR SCREEN: NOT DETECTED
PHENCYCLIDINE (PCP) UR S: NOT DETECTED
Tricyclic, Ur Screen: NOT DETECTED

## 2017-02-06 LAB — PREGNANCY, URINE: PREG TEST UR: NEGATIVE

## 2017-02-06 NOTE — Consult Note (Signed)
[facility] Face-to-Face Psychiatry Consult   Reason for Consult:  Consult for 46 year old woman with a history of bipolar disorders. Patient says she was acutely sad yesterday because of a migraine headache. Referring Physician:  Mariea Clonts Patient Identification: Monique Greer MRN:  [id number] Principal Diagnosis: Bipolar disorder current episode depressed [facility]) Diagnosis:   Patient Active Problem List   Diagnosis Date Noted  . Migraines [G43.909] 02/06/2017  . Bipolar disorder current episode depressed ([place]) [F31.30] 02/06/2017  . Bipolar disorder, current episode manic without psychotic features ([place]) [F31.10] 08/08/2016    Total Time spent with patient: 1 hour  Subjective:   Monique Greer is a 46 y.o. female patient admitted with patient interviewed. Chart reviewed. 46 year old woman brought to the emergency room at the encouragement of her family. Patient says that she has been having headaches more frequently and this is been upsetting her. Her mood is been a little bit more down and sad and anxious. She has some chronic problems with intermittent sleep. Denies that she's having auditory or visual hallucinations. Denies any current suicidal or homicidal thoughts. Admits that she banged a pan into her arm a week or 2 ago but says it was not with any suicidal intent and there is no evidence of any mood lasting injury from it. Denies any wish to die now. She says that she's been off of her psychiatric medicine for months largely because of finances. She admits that she uses marijuana intermittently. Denies other alcohol or drug abuse.  Social history: Patient lives with her daughter. Patient does not regularly work outside the home. Family is involved especially a sister.  Medical history: No significant ongoing medical problems other than migraine headaches  Substance abuse history: Denies any history of alcohol abuse. Admits that she uses marijuana intermittently.Marland Kitchen  HPI:  Past history of  psychiatric treatment including Lexapro and Abilify as well as some other medicines that she doesn't remember. She has had some cutting very occasionally in the past but denies other suicide attempts. Diagnosis is been of bipolar depression.  Past Psychiatric History: See note above  Risk to Self: Suicidal Ideation: No-Not Currently/Within Last 6 Months Suicidal Intent: No Is patient at risk for suicide?: No Suicidal Plan?: No Access to Means: No What has been your use of drugs/alcohol within the last 12 months?: Pt reports ongoing THC use How many times?:  ("I stopped counting") Other Self Harm Risks: No current outpatient/medicaion provider Triggers for Past Attempts: Unknown Intentional Self Injurious Behavior: Damaging, Bruising (damaged self w/ pen 3 wks ago) Comment - Self Injurious Behavior: damaged self on left inner forearm w/ pen 3 wks ago Risk to Others: Homicidal Ideation: No Thoughts of Harm to Others: No Current Homicidal Intent: No Current Homicidal Plan: No Access to Homicidal Means: No History of harm to others?: No Assessment of Violence: None Noted Does patient have access to weapons?: No Criminal Charges Pending?: No Does patient have a court date: No Prior Inpatient Therapy: Prior Inpatient Therapy: Yes Prior Therapy Dates: 2017 Prior Therapy Facilty/Provider(s): [facility] Reason for Treatment: SI Prior Outpatient Therapy: Prior Outpatient Therapy: Yes Prior Therapy Dates: Over a year before Prior Therapy Facilty/Provider(s): [facility] Reason for Treatment: PTSD, Schizophrenia Does patient have an ACCT team?: No Does patient have Intensive In-House Services?  : No Does patient have [facility] services? : No Does patient have P4CC services?: Unknown  Past Medical History:  Past Medical History:  Diagnosis Date  . Anxiety   . CHF (congestive heart failure) ([place])   .  Hyperlipidemia   . Hypertension   . Kidney infection   . Migraine   . Mitral valve prolapse   .  Syncope    most consistent with stress and vasovagal-induced  . Valvular heart disease     Past Surgical History:  Procedure Laterality Date  . ABDOMINAL HYSTERECTOMY    . BACK SURGERY    . CARPAL TUNNEL RELEASE     endoscopic  . COLONOSCOPY WITH PROPOFOL N/A 10/16/2015   Procedure: COLONOSCOPY WITH PROPOFOL;  Surgeon: Hulen Luster, MD;  Location: [facility] ENDOSCOPY;  Service: Gastroenterology;  Laterality: N/A;  . ESOPHAGOGASTRODUODENOSCOPY    . ESOPHAGOGASTRODUODENOSCOPY (EGD) WITH PROPOFOL N/A 10/16/2015   Procedure: ESOPHAGOGASTRODUODENOSCOPY (EGD) WITH PROPOFOL;  Surgeon: Hulen Luster, MD;  Location: [facility] ENDOSCOPY;  Service: Gastroenterology;  Laterality: N/A;  . TUBAL LIGATION    . WRIST GANGLION EXCISION     Family History: No family history on file. Family Psychiatric  History: Had an aunt with bipolar disorder in a grandfather with schizophrenia Social History:  History  Alcohol Use No     History  Drug Use  . Types: Marijuana    Social History   Social History  . Marital status: Legally Separated    Spouse name: N/A  . Number of children: N/A  . Years of education: N/A   Social History Main Topics  . Smoking status: Current Every Day Smoker    Packs/day: 1.00    Years: 36.00    Types: Cigarettes  . Smokeless tobacco: Never Used  . Alcohol use No  . Drug use: Yes    Types: Marijuana  . Sexual activity: Not on file   Other Topics Concern  . Not on file   Social History Narrative  . No narrative on file   Additional Social History:    Allergies:   Allergies  Allergen Reactions  . Sulfa Antibiotics Other (See Comments)    Reaction:  Stevens-Johnson syndrome   . Vicodin [Hydrocodone-Acetaminophen]     Labs:  Results for orders placed or performed during the hospital encounter of 02/05/17 (from the past 48 hour(s))  Comprehensive metabolic panel     Status: Abnormal   Collection Time: 02/05/17  6:55 PM  Result Value Ref Range   Sodium 136 135 - 145  mmol/L   Potassium 3.5 3.5 - 5.1 mmol/L   Chloride 106 101 - 111 mmol/L   CO2 24 22 - 32 mmol/L   Glucose, Bld 98 65 - 99 mg/dL   BUN 8 6 - 20 mg/dL   Creatinine, Ser 0.72 0.44 - 1.00 mg/dL   Calcium 9.2 8.9 - 10.3 mg/dL   Total Protein 7.6 6.5 - 8.1 g/dL   Albumin 4.1 3.5 - 5.0 g/dL   AST 18 15 - 41 U/L   ALT 11 (L) 14 - 54 U/L   Alkaline Phosphatase 61 38 - 126 U/L   Total Bilirubin 0.4 0.3 - 1.2 mg/dL   GFR calc non Af Amer >60 >60 mL/min   GFR calc Af Amer >60 >60 mL/min    Comment: (NOTE) The eGFR has been calculated using the CKD EPI equation. This calculation has not been validated in all clinical situations. eGFR's persistently <60 mL/min signify possible Chronic Kidney Disease.    Anion gap 6 5 - 15  cbc     Status: Abnormal   Collection Time: 02/05/17  6:55 PM  Result Value Ref Range   WBC 11.5 (H) 3.6 - 11.0 K/uL   RBC 5.04  3.80 - 5.20 MIL/uL   Hemoglobin 15.5 12.0 - 16.0 g/dL   HCT 44.7 35.0 - 47.0 %   MCV 88.7 80.0 - 100.0 fL   MCH 30.8 26.0 - 34.0 pg   MCHC 34.8 32.0 - 36.0 g/dL   RDW 13.0 11.5 - 14.5 %   Platelets 196 150 - 440 K/uL  Urine Drug Screen, Qualitative     Status: Abnormal   Collection Time: 02/06/17  4:05 AM  Result Value Ref Range   Tricyclic, Ur Screen NONE DETECTED NONE DETECTED   Amphetamines, Ur Screen NONE DETECTED NONE DETECTED   MDMA (Ecstasy)Ur Screen NONE DETECTED NONE DETECTED   Cocaine Metabolite,Ur Girard NONE DETECTED NONE DETECTED   Opiate, Ur Screen NONE DETECTED NONE DETECTED   Phencyclidine (PCP) Ur S NONE DETECTED NONE DETECTED   Cannabinoid 50 Ng, Ur Ina POSITIVE (A) NONE DETECTED   Barbiturates, Ur Screen NONE DETECTED NONE DETECTED   Benzodiazepine, Ur Scrn NONE DETECTED NONE DETECTED   Methadone Scn, Ur NONE DETECTED NONE DETECTED    Comment: (NOTE) 500  Tricyclics, urine               Cutoff 1000 ng/mL 200  Amphetamines, urine             Cutoff 1000 ng/mL 300  MDMA (Ecstasy), urine           Cutoff 500 ng/mL 400   Cocaine Metabolite, urine       Cutoff 300 ng/mL 500  Opiate, urine                   Cutoff 300 ng/mL 600  Phencyclidine (PCP), urine      Cutoff 25 ng/mL 700  Cannabinoid, urine              Cutoff 50 ng/mL 800  Barbiturates, urine             Cutoff 200 ng/mL 900  Benzodiazepine, urine           Cutoff 200 ng/mL 1000 Methadone, urine                Cutoff 300 ng/mL 1100 1200 The urine drug screen provides only a preliminary, unconfirmed 1300 analytical test result and should not be used for non-medical 1400 purposes. Clinical consideration and professional judgment should 1500 be applied to any positive drug screen result due to possible 1600 interfering substances. A more specific alternate chemical method 1700 must be used in order to obtain a confirmed analytical result.  1800 Gas chromato graphy / mass spectrometry (GC/MS) is the preferred 1900 confirmatory method.   Urinalysis, Routine w reflex microscopic     Status: Abnormal   Collection Time: 02/06/17  4:05 AM  Result Value Ref Range   Color, Urine YELLOW (A) YELLOW   APPearance CLEAR (A) CLEAR   Specific Gravity, Urine 1.029 1.005 - 1.030   pH 5.0 5.0 - 8.0   Glucose, UA NEGATIVE NEGATIVE mg/dL   Hgb urine dipstick NEGATIVE NEGATIVE   Bilirubin Urine NEGATIVE NEGATIVE   Ketones, ur NEGATIVE NEGATIVE mg/dL   Protein, ur 30 (A) NEGATIVE mg/dL   Nitrite NEGATIVE NEGATIVE   Leukocytes, UA NEGATIVE NEGATIVE   RBC / HPF 0-5 0 - 5 RBC/hpf   WBC, UA 0-5 0 - 5 WBC/hpf   Bacteria, UA NONE SEEN NONE SEEN   Squamous Epithelial / LPF 0-5 (A) NONE SEEN   Mucous PRESENT   Pregnancy, urine     Status: None  Collection Time: 02/06/17  4:05 AM  Result Value Ref Range   Preg Test, Ur NEGATIVE NEGATIVE    No current facility-administered medications for this encounter.    Current Outpatient Prescriptions  Medication Sig Dispense Refill  . acetaminophen (TYLENOL) 500 MG tablet Take 1,000 mg by mouth every 6 (six) hours as  needed for mild pain or headache.    Marland Kitchen amLODipine (NORVASC) 5 MG tablet Take 1 tablet by mouth daily.  11  . clonazePAM (KLONOPIN) 1 MG tablet Take 1 tablet by mouth 2 (two) times daily as needed.  0    Musculoskeletal: Strength & Muscle Tone: within normal limits Gait & Station: normal Patient leans: N/A  Psychiatric Specialty Exam: Physical Exam  Nursing note and vitals reviewed. Constitutional: She appears well-developed and well-nourished.  HENT:  Head: Normocephalic and atraumatic.  Eyes: Conjunctivae are normal. Pupils are equal, round, and reactive to light.  Neck: Normal range of motion.  Cardiovascular: Regular rhythm and normal heart sounds.   Respiratory: Effort normal. No respiratory distress.  GI: Soft.  Musculoskeletal: Normal range of motion.  Neurological: She is alert.  Skin: Skin is warm and dry.  Psychiatric: Her speech is normal and behavior is normal. Judgment normal. Her affect is blunt. Thought content is not paranoid. She expresses no homicidal and no suicidal ideation. She exhibits abnormal recent memory.    Review of Systems  Constitutional: Negative.   HENT: Negative.   Eyes: Negative.   Respiratory: Negative.   Cardiovascular: Negative.   Gastrointestinal: Negative.   Musculoskeletal: Negative.   Skin: Negative.   Neurological: Negative.   Psychiatric/Behavioral: Positive for depression and substance abuse. Negative for hallucinations, memory loss and suicidal ideas. The patient is nervous/anxious. The patient does not have insomnia.     Blood pressure (!) 150/66, pulse (!) 58, temperature 98 F (36.7 C), temperature source Oral, resp. rate 18, height _0  (1.6 m), weight 81.6 kg (180 lb), SpO2 96 %.Body mass index is 31.89 kg/m.  General Appearance: Casual  Eye Contact:  Good  Speech:  Normal Rate  Volume:  Normal  Mood:  Dysphoric  Affect:  Congruent  Thought Process:  Goal Directed  Orientation:  Full (Time, Place, and Person)  Thought  Content:  Logical  Suicidal Thoughts:  No  Homicidal Thoughts:  No  Memory:  Immediate;   Fair Recent;   Fair Remote;   Fair  Judgement:  Fair  Insight:  Fair  Psychomotor Activity:  Normal  Concentration:  Concentration: Fair  Recall:  [employer] of Knowledge:  Fair  Language:  Fair  Akathisia:  No  Handed:  Right  AIMS (if indicated):     Assets:  Desire for Improvement Physical Health Resilience  ADL's:  Intact  Cognition:  Impaired,  Mild  Sleep:        Treatment Plan Summary: Plan Patient is a 46 year old woman with a history of depression and anxiety symptoms and possible bipolar disorder. She is currently totally denying suicidal or homicidal thoughts and shows no evidence of psychosis. No evidence of acute injury. Patient is requesting discharge. Does not meet commitment criteria. Patient was educated that she can go to [place] despite having no insurance and try to get in there and get back on medicine. She is agreeable to the treatment plan. Case reviewed with emergency room doctor and TTS. She can be released from the emergency room to follow-up as an outpatient.  Disposition: Patient does not meet criteria for psychiatric inpatient admission.  Supportive therapy provided about ongoing stressors.  Alethia Berthold, MD 02/06/2017 5:13 PM

## 2017-02-06 NOTE — ED Notes (Signed)
Dr.clapaac in room with patient

## 2017-02-06 NOTE — ED Provider Notes (Signed)
-----------------------------------------   5:44 AM on 02/06/2017 -----------------------------------------   Blood pressure (!) 110/50, pulse (!) 51, temperature 99.1 F (37.3 C), temperature source Oral, resp. rate 18, height 5\' 3"  (1.6 m), weight 81.6 kg, SpO2 98 %.  The patient had no acute events since last update.  Calm and cooperative at this time.  Disposition is pending Psychiatry/Behavioral Medicine team recommendations.  No indication of any acute or emergent medical condition at this time.     Hinda Kehr, MD 02/06/17 [phone]

## 2017-02-06 NOTE — ED Notes (Signed)
Lunch was given to patient 

## 2017-02-06 NOTE — ED Notes (Signed)
Pt taking a shower. Linens changed.
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
## 2017-02-06 NOTE — ED Notes (Signed)
Pt given breakfast tray

## 2017-02-06 NOTE — ED Notes (Signed)
Calvin in room with patient

## 2017-02-06 NOTE — BH Assessment (Signed)
Per request of Psych MD (Dr. Weber Cooks), writer provided the patient with information and instructions on how to access Outpatient Mental Health & Substance Abuse Treatment ( ([facility] and [employer]).   Patient denies SI/HI and AV/H.

## 2017-02-28 ENCOUNTER — Encounter: Payer: Self-pay | Admitting: *Deleted

## 2017-02-28 ENCOUNTER — Emergency Department
Admission: EM | Admit: 2017-02-28 | Discharge: 2017-03-01 | Disposition: A | Discharge status: Home / Self Care | Payer: Medicaid Other | Attending: Emergency Medicine | Admitting: Emergency Medicine

## 2017-02-28 DIAGNOSIS — Z79899 Other long term (current) drug therapy: Secondary | ICD-10-CM | POA: Insufficient documentation

## 2017-02-28 DIAGNOSIS — F1010 Alcohol abuse, uncomplicated: Secondary | ICD-10-CM

## 2017-02-28 DIAGNOSIS — F17210 Nicotine dependence, cigarettes, uncomplicated: Secondary | ICD-10-CM | POA: Insufficient documentation

## 2017-02-28 DIAGNOSIS — N9489 Other specified conditions associated with female genital organs and menstrual cycle: Secondary | ICD-10-CM | POA: Insufficient documentation

## 2017-02-28 DIAGNOSIS — I110 Hypertensive heart disease with heart failure: Secondary | ICD-10-CM | POA: Insufficient documentation

## 2017-02-28 DIAGNOSIS — I509 Heart failure, unspecified: Secondary | ICD-10-CM | POA: Insufficient documentation

## 2017-02-28 DIAGNOSIS — F10129 Alcohol abuse with intoxication, unspecified: Secondary | ICD-10-CM | POA: Insufficient documentation

## 2017-02-28 LAB — CBC WITH DIFFERENTIAL/PLATELET
BASOS ABS: 0.1 10*3/uL (ref 0–0.1)
Basophils Relative: 1 %
EOS ABS: 0.1 10*3/uL (ref 0–0.7)
Eosinophils Relative: 1 %
HCT: 44.7 % (ref 35.0–47.0)
Hemoglobin: 15.6 g/dL (ref 12.0–16.0)
LYMPHS ABS: 3.7 10*3/uL — AB (ref 1.0–3.6)
Lymphocytes Relative: 33 %
MCH: 31 pg (ref 26.0–34.0)
MCHC: 34.8 g/dL (ref 32.0–36.0)
MCV: 89.2 fL (ref 80.0–100.0)
Monocytes Absolute: 0.6 10*3/uL (ref 0.2–0.9)
Monocytes Relative: 6 %
Neutro Abs: 6.7 10*3/uL — ABNORMAL HIGH (ref 1.4–6.5)
Neutrophils Relative %: 59 %
PLATELETS: 159 10*3/uL (ref 150–440)
RBC: 5.01 MIL/uL (ref 3.80–5.20)
RDW: 13.1 % (ref 11.5–14.5)
WBC: 11.3 10*3/uL — AB (ref 3.6–11.0)

## 2017-02-28 LAB — COMPREHENSIVE METABOLIC PANEL WITH GFR
ALT: 18 U/L (ref 14–54)
AST: 25 U/L (ref 15–41)
Albumin: 4.2 g/dL (ref 3.5–5.0)
Alkaline Phosphatase: 65 U/L (ref 38–126)
Anion gap: 12 (ref 5–15)
BUN: 13 mg/dL (ref 6–20)
CO2: 18 mmol/L — ABNORMAL LOW (ref 22–32)
Calcium: 9.1 mg/dL (ref 8.9–10.3)
Chloride: 108 mmol/L (ref 101–111)
Creatinine, Ser: 0.84 mg/dL (ref 0.44–1.00)
GFR calc Af Amer: >60 mL/min
GFR calc non Af Amer: >60 mL/min
Glucose, Bld: 94 mg/dL (ref 65–99)
Potassium: 3.1 mmol/L — ABNORMAL LOW (ref 3.5–5.1)
Sodium: 138 mmol/L (ref 135–145)
Total Bilirubin: 0.3 mg/dL (ref 0.3–1.2)
Total Protein: 7.4 g/dL (ref 6.5–8.1)

## 2017-02-28 LAB — ETHANOL: Alcohol, Ethyl (B): 170 mg/dL — ABNORMAL HIGH

## 2017-02-28 LAB — HCG, QUANTITATIVE, PREGNANCY: hCG, Beta Chain, Quant, S: <1 m[IU]/mL

## 2017-02-28 LAB — SALICYLATE LEVEL: Salicylate Lvl: <7 mg/dL (ref 2.8–30.0)

## 2017-02-28 LAB — ACETAMINOPHEN LEVEL: Acetaminophen (Tylenol), Serum: <10 ug/mL — ABNORMAL LOW (ref 10–30)

## 2017-02-28 MED ORDER — DIPHENHYDRAMINE HCL 50 MG/ML IJ SOLN
50.0000 mg | Freq: Once | INTRAMUSCULAR | Status: AC
  Administered 2017-02-28: 50 mg via INTRAMUSCULAR

## 2017-02-28 MED ORDER — HALOPERIDOL LACTATE 5 MG/ML IJ SOLN
5.0000 mg | Freq: Once | INTRAMUSCULAR | Status: AC
  Administered 2017-02-28: 5 mg via INTRAMUSCULAR

## 2017-02-28 MED ORDER — LORAZEPAM 2 MG/ML IJ SOLN
2.0000 mg | Freq: Once | INTRAMUSCULAR | Status: AC
  Administered 2017-02-28: 2 mg via INTRAMUSCULAR

## 2017-02-28 NOTE — BHH Counselor (Signed)
TTS unable to conduct assessment at this time; pt sleeping after being verbally aggressive towards ED staff.
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
## 2017-02-28 NOTE — ED Notes (Signed)
Pt sleeping in bed at this time. Pt calm and lights dimmed. Pt in NAD at this time.

## 2017-03-01 LAB — URINE DRUG SCREEN, QUALITATIVE (ARMC ONLY)
Amphetamines, Ur Screen: NOT DETECTED
BARBITURATES, UR SCREEN: NOT DETECTED
BENZODIAZEPINE, UR SCRN: POSITIVE — AB
CANNABINOID 50 NG, UR ~~LOC~~: POSITIVE — AB
Cocaine Metabolite,Ur ~~LOC~~: NOT DETECTED
MDMA (Ecstasy)Ur Screen: NOT DETECTED
Methadone Scn, Ur: NOT DETECTED
OPIATE, UR SCREEN: NOT DETECTED
PHENCYCLIDINE (PCP) UR S: NOT DETECTED
Tricyclic, Ur Screen: NOT DETECTED

## 2017-03-01 LAB — URINALYSIS, COMPLETE (UACMP) WITH MICROSCOPIC
BILIRUBIN URINE: NEGATIVE
GLUCOSE, UA: NEGATIVE mg/dL
HGB URINE DIPSTICK: NEGATIVE
KETONES UR: NEGATIVE mg/dL
Leukocytes, UA: NEGATIVE
NITRITE: NEGATIVE
PH: 5 (ref 5.0–8.0)
PROTEIN: 30 mg/dL — AB
Specific Gravity, Urine: 1.011 (ref 1.005–1.030)

## 2017-03-01 MED ORDER — FOSFOMYCIN TROMETHAMINE 3 G PO PACK
3.0000 g | PACK | Freq: Once | ORAL | Status: AC
  Administered 2017-03-01: 3 g via ORAL
  Filled 2017-03-01: qty 3

## 2017-03-01 MED ORDER — POTASSIUM CHLORIDE CRYS ER 20 MEQ PO TBCR
40.0000 meq | EXTENDED_RELEASE_TABLET | Freq: Once | ORAL | Status: AC
  Administered 2017-03-01: 40 meq via ORAL
  Filled 2017-03-01: qty 2

## 2017-03-01 NOTE — ED Notes (Signed)
AAOx3.  Skin warm and dry.  NAD.  Denies SI/HI.  Ambulates with easy and steady gait.

## 2017-03-01 NOTE — ED Notes (Signed)
SOC consult in progress.  
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
## 2017-03-01 NOTE — Discharge Instructions (Signed)
You have been seen in the Emergency Department (ED)  today for a psychiatric complaint.  You have been evaluated by psychiatry and we believe you are safe to be discharged from the hospital.   ° °Please return to the Emergency Department (ED)  immediately if you have ANY thoughts of hurting yourself or anyone else, so that we may help you. ° °Please avoid alcohol and drug use. ° °Follow up with your doctor and/or therapist as soon as possible regarding today's ED  visit.  ° °You may call crisis hotline for [place] at [phone]. ° °
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
## 2017-03-01 NOTE — ED Provider Notes (Signed)
-----------------------------------------   6:32 AM on 03/01/2017 -----------------------------------------   Blood pressure (!) 104/54, pulse 68, temperature 98.6 F (37 C), temperature source Oral, resp. rate 16, height 5\' 3"  (1.6 m), weight 180 lb (81.6 kg), SpO2 98 %.  The patient had no acute events since last update.  Calm and cooperative at this time.  Ambulating with steady gait. Awaiting [facility] psychiatry evaluation. Disposition is pending Psychiatry/Behavioral Medicine team recommendations.     Paulette Blanch, MD 03/01/17 [phone]

## 2017-03-01 NOTE — ED Provider Notes (Signed)
-----------------------------------------   9:36 AM on 03/01/2017 -----------------------------------------   Blood pressure (!) 104/54, pulse 68, temperature 98.6 F (37 C), temperature source Oral, resp. rate 16, height 5\' 3"  (1.6 m), weight 180 lb (81.6 kg), SpO2 98 %.  Patient has been evaluated by psychiatry and cleared for discharge. IVC lifted by Dr.Penalver, telepsych . Patient's labs have been reviewed with no acute findings. Patient will be discharged at this time to home. Patient given PO K and fosfomycin for UTI.    Rudene Re, MD 03/01/17 [phone]

## 2017-05-09 IMAGING — CR DG CHEST 2V
2 series · 2 of 2 positions shown · non-contrast
Comparison: Chest radiograph performed 04/08/2016

CLINICAL DATA: Acute onset of central chest pain and upper
abdominal pain. Nausea, dizziness and diaphoresis. Initial
encounter.

EXAM:
CHEST  2 VIEW

[chest pa]
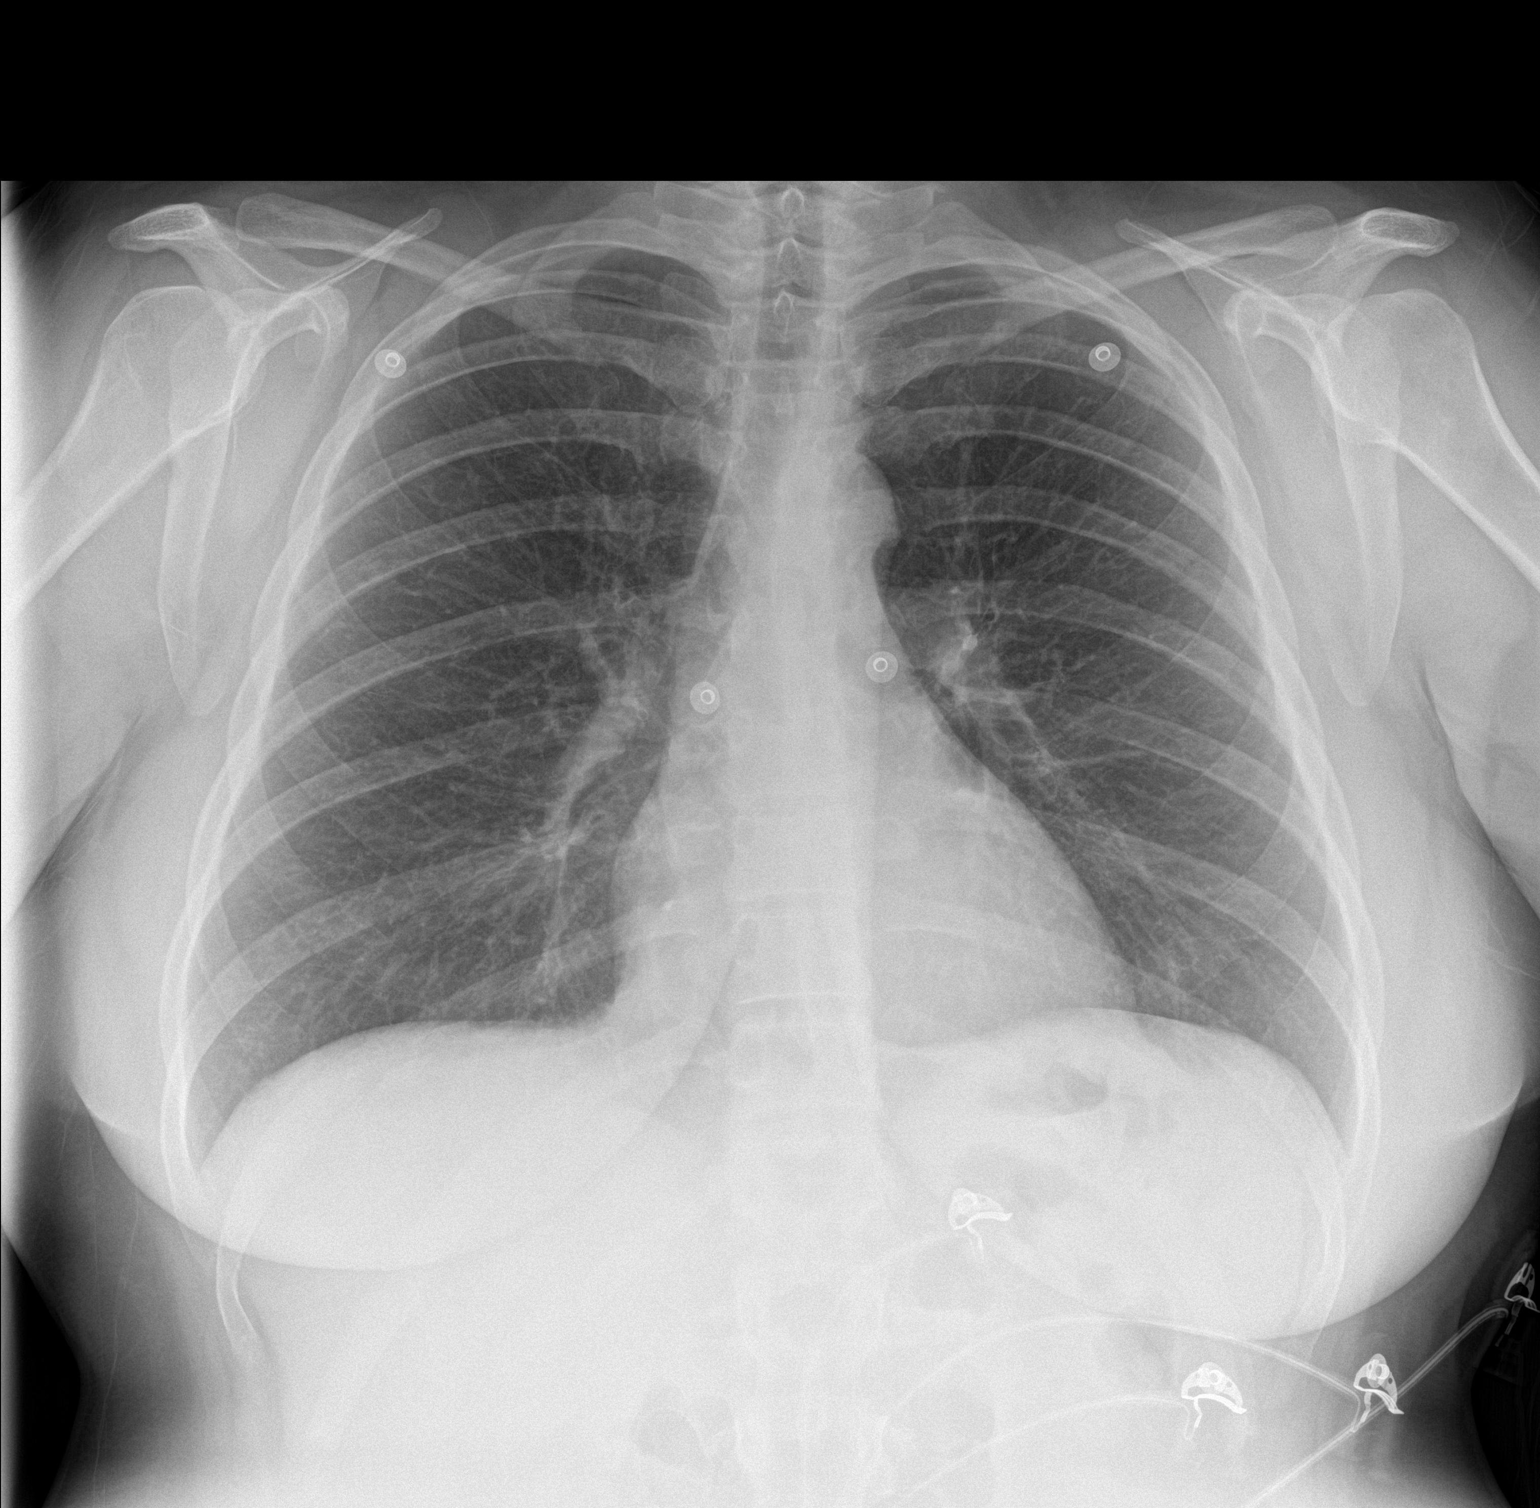

[chest lat]
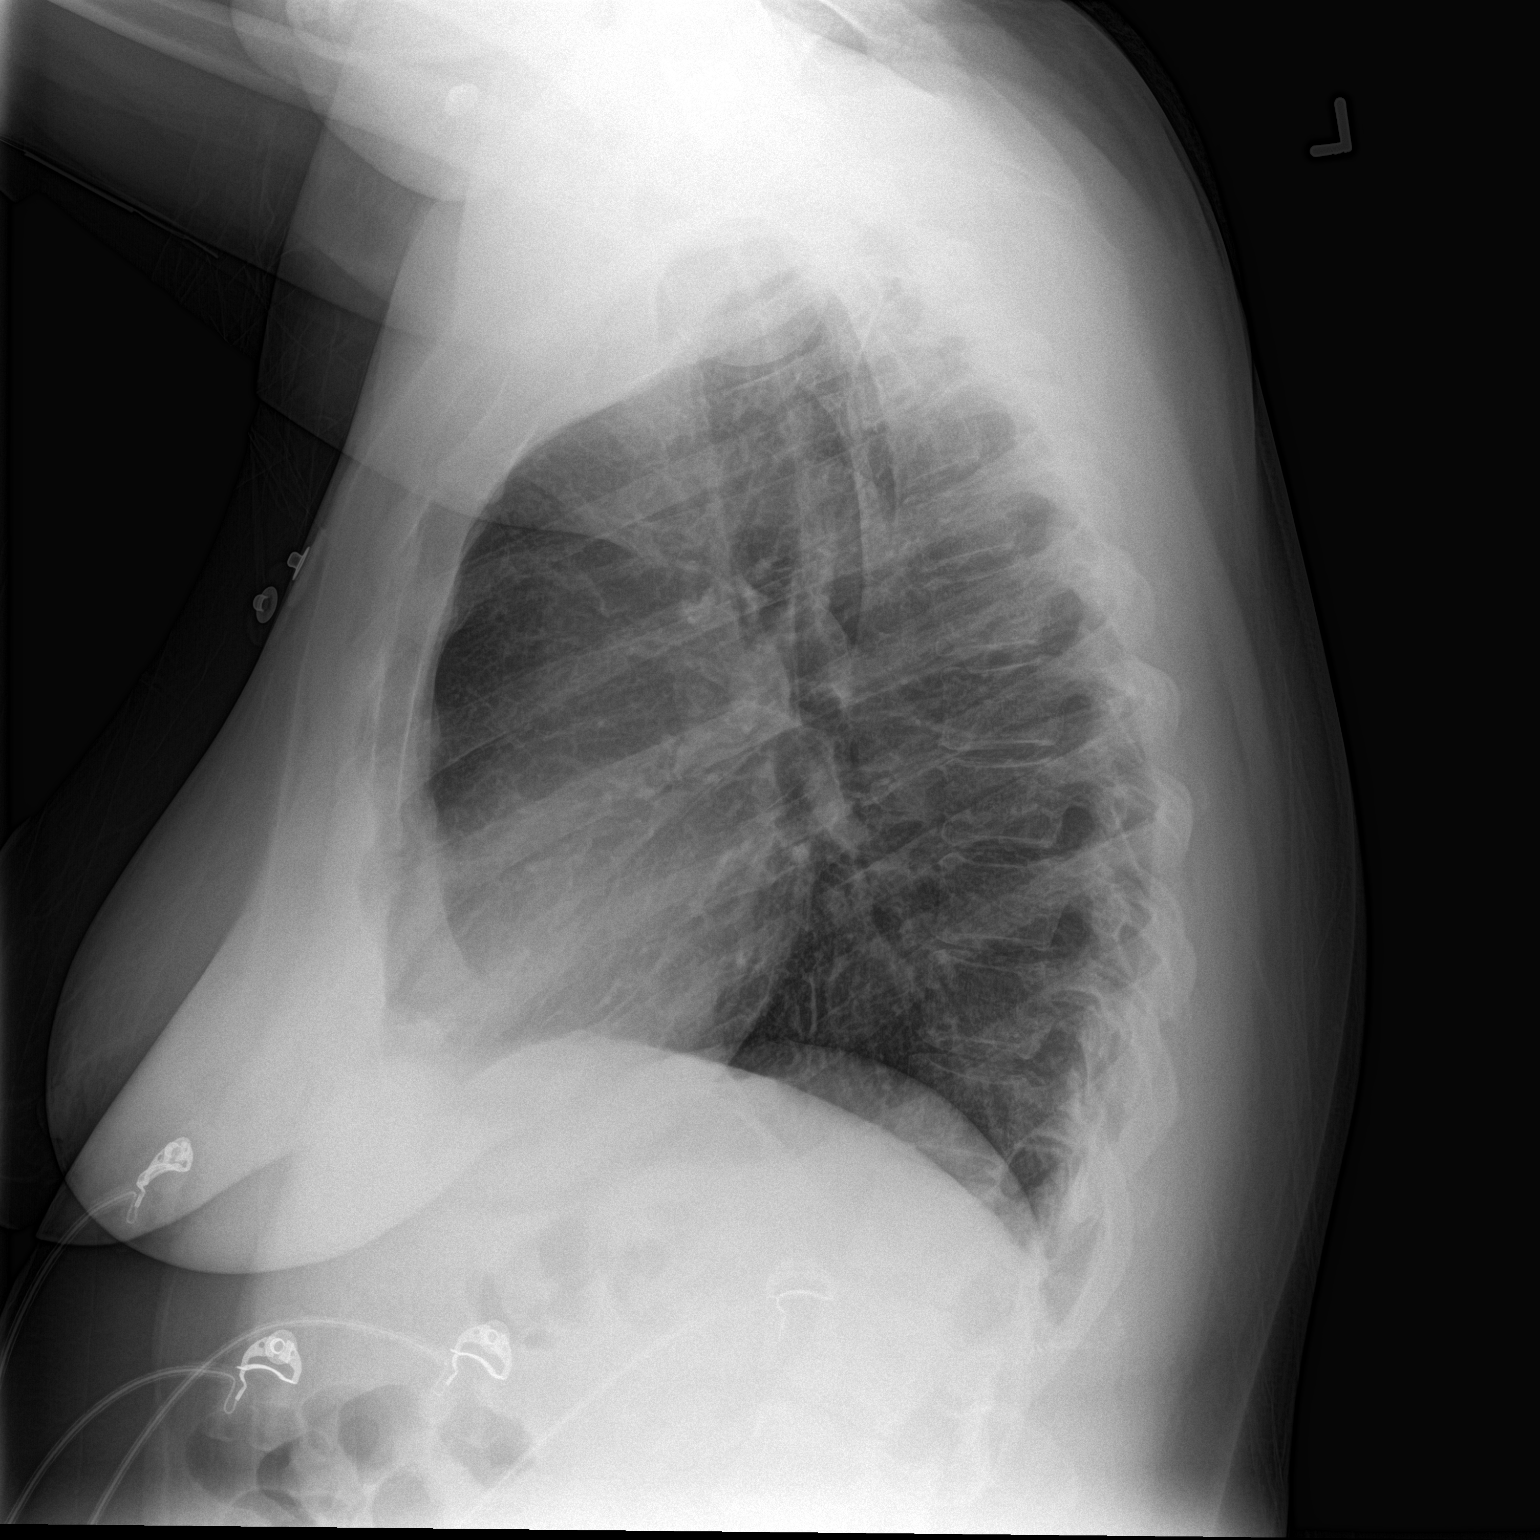

[2 of 2 positions shown; findings below may reference images not displayed]

FINDINGS: The lungs are well-aerated and clear. There is no evidence of focal
opacification, pleural effusion or pneumothorax.

The heart is normal in size; the mediastinal contour is within
normal limits. No acute osseous abnormalities are seen. Cervical
spinal fusion hardware is noted.
IMPRESSION: No acute cardiopulmonary process seen.

## 2017-05-16 ENCOUNTER — Encounter (HOSPITAL_COMMUNITY): Payer: Self-pay | Admitting: Emergency Medicine

## 2017-05-16 ENCOUNTER — Emergency Department (HOSPITAL_COMMUNITY): Payer: Self-pay

## 2017-05-16 DIAGNOSIS — I509 Heart failure, unspecified: Secondary | ICD-10-CM | POA: Insufficient documentation

## 2017-05-16 DIAGNOSIS — I110 Hypertensive heart disease with heart failure: Secondary | ICD-10-CM | POA: Insufficient documentation

## 2017-05-16 DIAGNOSIS — G4489 Other headache syndrome: Secondary | ICD-10-CM | POA: Insufficient documentation

## 2017-05-16 DIAGNOSIS — R072 Precordial pain: Secondary | ICD-10-CM | POA: Insufficient documentation

## 2017-05-16 DIAGNOSIS — F17210 Nicotine dependence, cigarettes, uncomplicated: Secondary | ICD-10-CM | POA: Insufficient documentation

## 2017-05-16 LAB — CBC
HCT: 43.8 % (ref 36.0–46.0)
HEMOGLOBIN: 15.3 g/dL — AB (ref 12.0–15.0)
MCH: 31.8 pg (ref 26.0–34.0)
MCHC: 34.9 g/dL (ref 30.0–36.0)
MCV: 91.1 fL (ref 78.0–100.0)
PLATELETS: 199 10*3/uL (ref 150–400)
RBC: 4.81 MIL/uL (ref 3.87–5.11)
RDW: 12.8 % (ref 11.5–15.5)
WBC: 8.7 10*3/uL (ref 4.0–10.5)

## 2017-05-16 LAB — BASIC METABOLIC PANEL
ANION GAP: 8 (ref 5–15)
BUN: 14 mg/dL (ref 6–20)
CALCIUM: 9.6 mg/dL (ref 8.9–10.3)
CO2: 25 mmol/L (ref 22–32)
CREATININE: 0.96 mg/dL (ref 0.44–1.00)
Chloride: 105 mmol/L (ref 101–111)
GFR calc Af Amer: >60 mL/min (ref >60)
GFR calc non Af Amer: >60 mL/min (ref >60)
GLUCOSE: 118 mg/dL — AB (ref 65–99)
Potassium: 4.5 mmol/L (ref 3.5–5.1)
Sodium: 138 mmol/L (ref 135–145)

## 2017-05-16 LAB — I-STAT TROPONIN, ED: TROPONIN I, POC: 0 ng/mL (ref 0.00–0.08)

## 2017-05-16 NOTE — ED Triage Notes (Signed)
Reports having chest pain off and on all day as well as pain in left side of neck going into head.  Also c/o N/V X 1.

## 2017-05-17 ENCOUNTER — Emergency Department (HOSPITAL_COMMUNITY)
Admission: EM | Admit: 2017-05-17 | Discharge: 2017-05-17 | Disposition: A | Discharge status: Home / Self Care | Payer: Self-pay | Attending: Emergency Medicine | Admitting: Emergency Medicine

## 2017-05-17 DIAGNOSIS — R072 Precordial pain: Secondary | ICD-10-CM

## 2017-05-17 DIAGNOSIS — G4489 Other headache syndrome: Secondary | ICD-10-CM

## 2017-05-17 HISTORY — DX: Post-traumatic stress disorder, unspecified: F43.10

## 2017-05-17 LAB — I-STAT TROPONIN, ED: TROPONIN I, POC: 0.01 ng/mL (ref 0.00–0.08)

## 2017-05-17 MED ORDER — ONDANSETRON 4 MG PO TBDP
8.0000 mg | ORAL_TABLET | Freq: Once | ORAL | Status: AC
  Administered 2017-05-17: 8 mg via ORAL
  Filled 2017-05-17: qty 2

## 2017-05-17 MED ORDER — IBUPROFEN 400 MG PO TABS
600.0000 mg | ORAL_TABLET | Freq: Once | ORAL | Status: AC
  Administered 2017-05-17: 600 mg via ORAL
  Filled 2017-05-17: qty 1

## 2017-05-17 NOTE — ED Provider Notes (Signed)
[facility] DEPT Provider Note   CSN: [id number] Arrival date & time: 05/16/17  2222     History   Chief Complaint Chief Complaint  Patient presents with  . Chest Pain  . Nausea    HPI Monique Greer is a 46 y.o. female.  The history is provided by the patient.  Chest Pain   This is a new problem. The current episode started more than 2 days ago. The problem occurs daily. The problem has not changed since onset.Associated with: nothing. The pain is present in the substernal region (left chest). The pain is moderate. The quality of the pain is described as pressure-like and sharp. Associated symptoms include diaphoresis, headaches, nausea, shortness of breath and vomiting. Pertinent negatives include no fever, no hemoptysis, no syncope and no weakness. She has tried nothing for the symptoms.  Pertinent negatives for past medical history include no CAD, no DVT and no PE.  Her family medical history is significant for CAD.   Patient presents for several complaints:  1. She reports left sided and substernal CP, at times it is sharp, other times it is pressure.  Not worsened with exertion.  It is not usually pleuritic.  She reports episodes last for several minutes then resolves.  No fever/vomiting No hemoptysis.  She denies h/o CAD/PE No recent travel.  No estrogen products She is a smoker  2. She reports typical migraine for past several days and facial pressure No focal weakness No fever reported She also reports associated neck pain as well Past Medical History:  Diagnosis Date  . Anxiety   . CHF (congestive heart failure) ([place])   . Hyperlipidemia   . Hypertension   . Kidney infection   . Migraine   . Mitral valve prolapse   . PTSD (post-traumatic stress disorder)   . Syncope    most consistent with stress and vasovagal-induced  . Valvular heart disease     Patient Active Problem List   Diagnosis Date Noted  . Migraines 02/06/2017  . Bipolar disorder current  episode depressed ([place]) 02/06/2017  . Bipolar disorder, current episode manic without psychotic features ([place]) 08/08/2016    Past Surgical History:  Procedure Laterality Date  . ABDOMINAL HYSTERECTOMY    . BACK SURGERY    . CARPAL TUNNEL RELEASE     endoscopic  . COLONOSCOPY WITH PROPOFOL N/A 10/16/2015   Procedure: COLONOSCOPY WITH PROPOFOL;  Surgeon: Hulen Luster, MD;  Location: [facility] ENDOSCOPY;  Service: Gastroenterology;  Laterality: N/A;  . ESOPHAGOGASTRODUODENOSCOPY    . ESOPHAGOGASTRODUODENOSCOPY (EGD) WITH PROPOFOL N/A 10/16/2015   Procedure: ESOPHAGOGASTRODUODENOSCOPY (EGD) WITH PROPOFOL;  Surgeon: Hulen Luster, MD;  Location: [facility] ENDOSCOPY;  Service: Gastroenterology;  Laterality: N/A;  . TUBAL LIGATION    . WRIST GANGLION EXCISION      OB History    No data available       Home Medications    Prior to Admission medications   Not on File    Family History No family history on file.  Social History Social History  Substance Use Topics  . Smoking status: Current Every Day Smoker    Packs/day: 1.00    Years: 36.00    Types: Cigarettes  . Smokeless tobacco: Never Used  . Alcohol use No     Allergies   Sulfa antibiotics and Vicodin [hydrocodone-acetaminophen]   Review of Systems Review of Systems  Constitutional: Positive for diaphoresis. Negative for fever.  Respiratory: Positive for shortness of breath. Negative for hemoptysis.  Cardiovascular: Positive for chest pain. Negative for syncope.  Gastrointestinal: Positive for nausea and vomiting.  Neurological: Positive for headaches. Negative for weakness.  All other systems reviewed and are negative.    Physical Exam Updated Vital Signs BP 107/89   Pulse (!) 47   Temp 98.1 F (36.7 C) (Oral)   Resp (!) 22   Ht 1.6 m (5\' 3" )   Wt 77.1 kg (170 lb)   SpO2 98%   BMI 30.11 kg/m   Physical Exam CONSTITUTIONAL: Well developed/well nourished HEAD: Normocephalic/atraumatic EYES: EOMI/PERRL ENMT:  Mucous membranes moist NECK: supple no meningeal signs SPINE/BACK:entire spine nontender CV: S1/S2 noted, no murmurs/rubs/gallops noted LUNGS: Lungs are clear to auscultation bilaterally, no apparent distress ABDOMEN: soft, nontender, no rebound or guarding, bowel sounds noted throughout abdomen GU:no cva tenderness NEURO: Pt is awake/alert/appropriate, moves all extremitiesx4.  No facial droop.  No arm or left drift. EXTREMITIES: pulses normal/equal, full ROM.  No LE edema or tenderness SKIN: warm, color normal PSYCH: anxious  ED Treatments / Results  Labs (all labs ordered are listed, but only abnormal results are displayed) Labs Reviewed  BASIC METABOLIC PANEL - Abnormal; Notable for the following:       Result Value   Glucose, Bld 118 (*)    All other components within normal limits  CBC - Abnormal; Notable for the following:    Hemoglobin 15.3 (*)    All other components within normal limits  I-STAT TROPOININ, ED  I-STAT TROPOININ, ED    EKG  EKG Interpretation  Date/Time:  Friday May 16 2017 22:23:55 EDT Ventricular Rate:  57 PR Interval:  90 QRS Duration: 94 QT Interval:  398 QTC Calculation: 387 R Axis:   63 Text Interpretation:  Sinus bradycardia with short PR Otherwise normal ECG No significant change since last tracing Confirmed by Ripley Fraise [id number]) on 05/17/2017 5:05:08 AM       Radiology Dg Chest 2 View  Result Date: 05/16/2017 CLINICAL DATA:  Chest pain radiating to the back. Left-sided neck pain for 2 days EXAM: CHEST  2 VIEW COMPARISON:  01/10/2017 FINDINGS: The lungs are clear. The pulmonary vasculature is normal. Heart size is normal. Hilar and mediastinal contours are unremarkable. There is no pleural effusion. IMPRESSION: No active cardiopulmonary disease. Electronically Signed   By: Andreas Newport M.D.   On: 05/16/2017 23:18    Procedures Procedures (including critical care time)  Medications Ordered in ED Medications  ondansetron  (ZOFRAN-ODT) disintegrating tablet 8 mg (8 mg Oral Given 05/17/17 0532)  ibuprofen (ADVIL,MOTRIN) tablet 600 mg (600 mg Oral Given 05/17/17 0532)     Initial Impression / Assessment and Plan / ED Course  I have reviewed the triage vital signs and the nursing notes.  Pertinent labs & imaging results that were available during my care of the patient were reviewed by me and considered in my medical decision making (see chart for details).     6:47 AM HEART score 2 Repeat troponin negative, I doubt ACS As for HA - no acute findings, no distress, similar to prior HA Will d/c home Doubt PE given history/exam/vitals I have low suspicion for Dissection There is some report of CHF but previous echo on care everywhere  Shows normal EF   I feel she is appropriate for d/c home  Final Clinical Impressions(s) / ED Diagnoses   Final diagnoses:  None    New Prescriptions New Prescriptions   No medications on file     Ripley Fraise, MD 05/17/17  0657  

## 2017-05-17 NOTE — Discharge Instructions (Signed)

## 2019-08-22 ENCOUNTER — Other Ambulatory Visit: Payer: Self-pay

## 2019-08-22 ENCOUNTER — Encounter (HOSPITAL_COMMUNITY): Payer: Self-pay | Admitting: *Deleted

## 2019-08-22 ENCOUNTER — Emergency Department (HOSPITAL_COMMUNITY): Payer: Self-pay

## 2019-08-22 ENCOUNTER — Emergency Department (HOSPITAL_COMMUNITY)
Admission: EM | Admit: 2019-08-22 | Discharge: 2019-08-22 | Disposition: A | Discharge status: Home / Self Care | Payer: Self-pay | Attending: Emergency Medicine | Admitting: Emergency Medicine

## 2019-08-22 DIAGNOSIS — R1030 Lower abdominal pain, unspecified: Secondary | ICD-10-CM | POA: Insufficient documentation

## 2019-08-22 DIAGNOSIS — K648 Other hemorrhoids: Secondary | ICD-10-CM | POA: Insufficient documentation

## 2019-08-22 DIAGNOSIS — F17210 Nicotine dependence, cigarettes, uncomplicated: Secondary | ICD-10-CM | POA: Insufficient documentation

## 2019-08-22 DIAGNOSIS — I110 Hypertensive heart disease with heart failure: Secondary | ICD-10-CM | POA: Insufficient documentation

## 2019-08-22 DIAGNOSIS — I509 Heart failure, unspecified: Secondary | ICD-10-CM | POA: Insufficient documentation

## 2019-08-22 DIAGNOSIS — Z79899 Other long term (current) drug therapy: Secondary | ICD-10-CM | POA: Insufficient documentation

## 2019-08-22 DIAGNOSIS — K625 Hemorrhage of anus and rectum: Secondary | ICD-10-CM | POA: Insufficient documentation

## 2019-08-22 LAB — CBC
HCT: 43.7 % (ref 36.0–46.0)
Hemoglobin: 14.7 g/dL (ref 12.0–15.0)
MCH: 29.8 pg (ref 26.0–34.0)
MCHC: 33.6 g/dL (ref 30.0–36.0)
MCV: 88.6 fL (ref 80.0–100.0)
Platelets: 192 10*3/uL (ref 150–400)
RBC: 4.93 MIL/uL (ref 3.87–5.11)
RDW: 13.3 % (ref 11.5–15.5)
WBC: 9.1 10*3/uL (ref 4.0–10.5)
nRBC: 0 % (ref 0.0–0.2)

## 2019-08-22 LAB — COMPREHENSIVE METABOLIC PANEL
ALT: 13 U/L (ref 0–44)
AST: 14 U/L — ABNORMAL LOW (ref 15–41)
Albumin: 4 g/dL (ref 3.5–5.0)
Alkaline Phosphatase: 93 U/L (ref 38–126)
Anion gap: 10 (ref 5–15)
BUN: 15 mg/dL (ref 6–20)
CO2: 20 mmol/L — ABNORMAL LOW (ref 22–32)
Calcium: 9.7 mg/dL (ref 8.9–10.3)
Chloride: 108 mmol/L (ref 98–111)
Creatinine, Ser: 0.91 mg/dL (ref 0.44–1.00)
GFR calc Af Amer: >60 mL/min (ref >60)
GFR calc non Af Amer: >60 mL/min (ref >60)
Glucose, Bld: 98 mg/dL (ref 70–99)
Potassium: 3.9 mmol/L (ref 3.5–5.1)
Sodium: 138 mmol/L (ref 135–145)
Total Bilirubin: 0.4 mg/dL (ref 0.3–1.2)
Total Protein: 7.4 g/dL (ref 6.5–8.1)

## 2019-08-22 LAB — TYPE AND SCREEN
ABO/RH(D): A POS
Antibody Screen: NEGATIVE

## 2019-08-22 LAB — POC OCCULT BLOOD, ED: Fecal Occult Bld: NEGATIVE

## 2019-08-22 MED ORDER — SODIUM CHLORIDE 0.9 % IV BOLUS
1000.0000 mL | Freq: Once | INTRAVENOUS | Status: AC
  Administered 2019-08-22: 1000 mL via INTRAVENOUS

## 2019-08-22 MED ORDER — IOHEXOL 300 MG/ML  SOLN
100.0000 mL | Freq: Once | INTRAMUSCULAR | Status: AC | PRN
Start: 2019-08-22 — End: 2019-08-22
  Administered 2019-08-22: 22:00:00 100 mL via INTRAVENOUS

## 2019-08-22 MED ORDER — FENTANYL CITRATE (PF) 100 MCG/2ML IJ SOLN
50.0000 ug | Freq: Once | INTRAMUSCULAR | Status: AC
  Administered 2019-08-22: 22:00:00 50 ug via INTRAVENOUS
  Filled 2019-08-22: qty 2

## 2019-08-22 NOTE — ED Provider Notes (Addendum)
[facility] EMERGENCY DEPARTMENT Provider Note   CSN: [id number] Arrival date & time: 08/22/19  1821     History   Chief Complaint Chief Complaint  Patient presents with  . Rectal Bleeding    HPI Monique Greer is a 48 y.o. female.     49 y.o female with a PMH of CHF, HTN, Syncope presents to the ED with a chief complaint of lower back pain and abdominal pain x a couple of days. Patient describes a crampy sensation to her right lower quadrant with radiation to her umbilicus, reports this pain is constant.  States she is had some decrease in appetite, reports she was unable to finish her supper yesterday.  She reports having one episode of black tarry stool, this happened yesterday and was followed by "a lot of red blood on the toilet ".She reports having normal bowel movements after this incident. She does have a prior history of hemorrhoids but states this felt different. She also endorses lower back pain, reports is been going on for several years, it is usually radiates down her leg, reports her abdominal pain is making her back pain feel worse.  She denies any fever, nausea, vomiting, urinary symptoms.  The history is provided by the patient and medical records.  Rectal Bleeding Associated symptoms: abdominal pain   Associated symptoms: no fever, no light-headedness and no vomiting     Past Medical History:  Diagnosis Date  . Anxiety   . CHF (congestive heart failure) ([place])   . Hyperlipidemia   . Hypertension   . Kidney infection   . Migraine   . Mitral valve prolapse   . PTSD (post-traumatic stress disorder)   . Syncope    most consistent with stress and vasovagal-induced  . Valvular heart disease     Patient Active Problem List   Diagnosis Date Noted  . Migraines 02/06/2017  . Bipolar disorder current episode depressed ([place]) 02/06/2017  . Bipolar disorder, current episode manic without psychotic features ([place]) 08/08/2016    Past Surgical  History:  Procedure Laterality Date  . ABDOMINAL HYSTERECTOMY    . BACK SURGERY    . CARPAL TUNNEL RELEASE     endoscopic  . COLONOSCOPY WITH PROPOFOL N/A 10/16/2015   Procedure: COLONOSCOPY WITH PROPOFOL;  Surgeon: Hulen Luster, MD;  Location[facility] ENDOSCOPY;  Service: Gastroenterology;  Laterality: N/A;  . ESOPHAGOGASTRODUODENOSCOPY    . ESOPHAGOGASTRODUODENOSCOPY (EGD) WITH PROPOFOL N/A 10/16/2015   Procedure: ESOPHAGOGASTRODUODENOSCOPY (EGD) WITH PROPOFOL;  Surgeon: Hulen Luster, MD;  Location: [facility] ENDOSCOPY;  Service: Gastroenterology;  Laterality: N/A;  . TUBAL LIGATION    . WRIST GANGLION EXCISION       OB History   No obstetric history on file.      Home Medications    Prior to Admission medications   Medication Sig Start Date End Date Taking? Authorizing Provider  hydrOXYzine (ATARAX/VISTARIL) 25 MG tablet Take 25 mg by mouth at bedtime.    Yes [provider]  sertraline (ZOLOFT) 50 MG tablet Take 50 mg by mouth at bedtime.    Yes [provider]  topiramate (TOPAMAX) 50 MG tablet Take 50 mg by mouth at bedtime.    Yes [provider]    Family History No family history on file.  Social History Social History   Tobacco Use  . Smoking status: Current Every Day Smoker    Packs/day: 1.00    Years: 36.00    Pack years: 36.00  Types: Cigarettes  . Smokeless tobacco: Never Used  Substance Use Topics  . Alcohol use: No  . Drug use: Yes    Types: Marijuana     Allergies   Sulfa antibiotics and Vicodin [hydrocodone-acetaminophen]   Review of Systems Review of Systems  Constitutional: Negative for fever.  HENT: Negative for sore throat.   Eyes: Negative for pain.  Respiratory: Negative for shortness of breath.   Cardiovascular: Negative for chest pain.  Gastrointestinal: Positive for abdominal pain and hematochezia. Negative for nausea and vomiting.  Genitourinary: Negative for flank pain.  Musculoskeletal: Positive for back pain  (chronic).  Skin: Negative for pallor and wound.  Neurological: Negative for light-headedness and headaches.     Physical Exam Updated Vital Signs BP (!) 154/56 (BP Location: Left Arm)   Pulse (!) 52   Temp 98.5 F (36.9 C) (Oral)   Resp 16   SpO2 99%   Physical Exam Vitals signs and nursing note reviewed.  Constitutional:      Appearance: Normal appearance. She is not ill-appearing.     Comments: Non ill appearing.   HENT:     Head: Normocephalic and atraumatic.     Nose: Nose normal.     Mouth/Throat:     Mouth: Mucous membranes are moist.  Eyes:     Pupils: Pupils are equal, round, and reactive to light.  Neck:     Musculoskeletal: Normal range of motion and neck supple.  Cardiovascular:     Rate and Rhythm: Normal rate.  Pulmonary:     Effort: Pulmonary effort is normal.     Breath sounds: Rhonchi present.  Abdominal:     General: Abdomen is flat. Bowel sounds are normal.     Palpations: There is no mass.     Tenderness: There is abdominal tenderness in the right lower quadrant and suprapubic area. There is guarding. There is no rebound.     Hernia: No hernia is present.     Comments: Abdomen is soft with some mild tenderness to palpation along the right lower quadrant and suprapubic region.  Bowel sounds are normal.  Musculoskeletal:        General: No tenderness.  Skin:    General: Skin is warm and dry.  Neurological:     Mental Status: She is alert and oriented to person, place, and time.      ED Treatments / Results  Labs (all labs ordered are listed, but only abnormal results are displayed) Labs Reviewed  COMPREHENSIVE METABOLIC PANEL - Abnormal; Notable for the following components:      Result Value   CO2 20 (*)    AST 14 (*)    All other components within normal limits  CBC  POC OCCULT BLOOD, ED  TYPE AND SCREEN  ABO/RH    EKG None  Radiology Ct Abdomen Pelvis W Contrast  Result Date: 08/22/2019 CLINICAL DATA:  Right lower quadrant  pain EXAM: CT ABDOMEN AND PELVIS WITH CONTRAST TECHNIQUE: Multidetector CT imaging of the abdomen and pelvis was performed using the standard protocol following bolus administration of intravenous contrast. CONTRAST:  131mL OMNIPAQUE IOHEXOL 300 MG/ML  SOLN COMPARISON:  October 30, 2018, July 24, 2015 FINDINGS: Lower chest: The visualized heart size within normal limits. No pericardial fluid/thickening. No hiatal hernia. The visualized portions of the lungs are clear. Hepatobiliary: The liver is normal in density without focal abnormality.The main portal vein is patent. No evidence of calcified gallstones, gallbladder wall thickening or biliary dilatation. Pancreas: Unremarkable. No  pancreatic ductal dilatation or surrounding inflammatory changes. Spleen: Normal in size without focal abnormality. Adrenals/Urinary Tract: Both adrenal glands appear normal. The kidneys and collecting system appear normal without evidence of urinary tract calculus or hydronephrosis. Bladder is unremarkable. Stomach/Bowel: The stomach and small bowel are normal in appearance. There appears to be diffuse bowel wall thickening involving the hepatic flexure and right colon with mild surrounding fat stranding changes. No definite intraluminal mass is seen. The appendix is normal in appearance. No inflammatory changes, wall thickening, or obstructive findings.The appendix is normal. Vascular/Lymphatic: There are no enlarged mesenteric, retroperitoneal, or pelvic lymph nodes. Of Reproductive: The patient is status post hysterectomy. No adnexal masses or collections seen. Other: No evidence of abdominal wall mass or hernia. Musculoskeletal: No acute or significant osseous findings. IMPRESSION: Findings consistent with colitis involving the right colon and hepatic flexure. This could be due to infectious or inflammatory process. Would recommend direct visualization upon resolution of symptoms. Electronically Signed   By: Prudencio Pair M.D.    On: 08/22/2019 22:08    Procedures Procedures (including critical care time)  Medications Ordered in ED Medications  sodium chloride 0.9 % bolus 1,000 mL (1,000 mLs Intravenous New Bag/Given 08/22/19 2121)  fentaNYL (SUBLIMAZE) injection 50 mcg (50 mcg Intravenous Given 08/22/19 2203)  iohexol (OMNIPAQUE) 300 MG/ML solution 100 mL (100 mLs Intravenous Contrast Given 08/22/19 2148)     Initial Impression / Assessment and Plan / ED Course  I have reviewed the triage vital signs and the nursing notes.  Pertinent labs & imaging results that were available during my care of the patient were reviewed by me and considered in my medical decision making (see chart for details).       Patient with a PMH of hemorrhoids presents to the ED with a chief complaint of rectal bleeding along with lower abdominal pain which began couple days ago.  She also endorses some anorexia, has had no nausea or vomiting or fevers.  Does report one episode of black stools.  Patient reported no prior surgical history to her abdomen, bowel movements have been normal after the 1 bloody bowel movement that she had previously.  CMP showed no electrolyte derangement, creatinine level is within normal limits.  LFTs are unremarkable, low suspicion for any gallbladder pathology.  CBC without any leukocytosis, hemoglobin stable at 14.7.  Patient was provided with fluids, fentanyl to help with her pain.  CT abdomen showed:  Findings consistent with colitis involving the right colon and  hepatic flexure. This could be due to infectious or inflammatory  process. Would recommend direct visualization upon resolution of  symptoms.     11:01 PM patient was reassessed by me, reports improvement in pain after medication.  Hemoccult was obtained, she does have 2 hemorrhoids visible in the external area.  These are very tender to palpation.  Encouraged parent to obtain some Preparation H to help with the symptoms.  She also need to  increase her fiber in her diet.  Patient with otherwise stable vital signs.  Stable for discharge.  hemocult is negative.   Portions of this note were generated with [employer]. Dictation errors may occur despite best attempts at proofreading.  Final Clinical Impressions(s) / ED Diagnoses   Final diagnoses:  Rectal bleeding  Lower abdominal pain  Other hemorrhoids    ED Discharge Orders    None       Janeece Fitting, PA-C 08/22/19 [place], Nidya Bouyer, PA-C 08/22/19 2316  Valarie Merino, MD 08/23/19 [phone]

## 2019-08-22 NOTE — ED Triage Notes (Signed)
Pt reporting right lower abdominal and lower back pain for several days, worsening. Pt says she "filled up the toilet with darker red blood" last night. Nausea and dizziness. No blood thinners/aspirin.

## 2019-08-22 NOTE — Discharge Instructions (Addendum)
Your CT today was normal without any acute process.  Your laboratory results were within normal limits today.  You may purchase some over-the-counter fiber along with MiraLAX to help with your bowel movements.  You may also obtain some Preparation H to help with your hemorrhoids.  Please follow-up with your primary care physician as needed.

## 2019-08-23 LAB — ABO/RH: ABO/RH(D): A POS

## 2021-12-13 ENCOUNTER — Emergency Department: Payer: Medicare HMO

## 2021-12-13 ENCOUNTER — Other Ambulatory Visit: Payer: Self-pay

## 2021-12-13 ENCOUNTER — Emergency Department
Admission: EM | Admit: 2021-12-13 | Discharge: 2021-12-13 | Disposition: A | Discharge status: Home / Self Care | Payer: Medicare HMO | Attending: Emergency Medicine | Admitting: Emergency Medicine

## 2021-12-13 DIAGNOSIS — I110 Hypertensive heart disease with heart failure: Secondary | ICD-10-CM | POA: Diagnosis not present

## 2021-12-13 DIAGNOSIS — R002 Palpitations: Secondary | ICD-10-CM | POA: Diagnosis present

## 2021-12-13 DIAGNOSIS — I509 Heart failure, unspecified: Secondary | ICD-10-CM | POA: Diagnosis not present

## 2021-12-13 LAB — CBC
HCT: 42.1 % (ref 36.0–46.0)
Hemoglobin: 13.9 g/dL (ref 12.0–15.0)
MCH: 29.8 pg (ref 26.0–34.0)
MCHC: 33 g/dL (ref 30.0–36.0)
MCV: 90.3 fL (ref 80.0–100.0)
Platelets: 242 10*3/uL (ref 150–400)
RBC: 4.66 MIL/uL (ref 3.87–5.11)
RDW: 13.1 % (ref 11.5–15.5)
WBC: 8.4 10*3/uL (ref 4.0–10.5)
nRBC: 0 % (ref 0.0–0.2)

## 2021-12-13 LAB — BASIC METABOLIC PANEL
Anion gap: 11 (ref 5–15)
BUN: 9 mg/dL (ref 6–20)
CO2: 23 mmol/L (ref 22–32)
Calcium: 9.7 mg/dL (ref 8.9–10.3)
Chloride: 104 mmol/L (ref 98–111)
Creatinine, Ser: 0.84 mg/dL (ref 0.44–1.00)
GFR, Estimated: >60 mL/min (ref >60)
Glucose, Bld: 109 mg/dL — ABNORMAL HIGH (ref 70–99)
Potassium: 4.2 mmol/L (ref 3.5–5.1)
Sodium: 138 mmol/L (ref 135–145)

## 2021-12-13 LAB — MAGNESIUM: Magnesium: 2.1 mg/dL (ref 1.7–2.4)

## 2021-12-13 LAB — TSH: TSH: 1.127 u[IU]/mL (ref 0.350–4.500)

## 2021-12-13 LAB — TROPONIN I (HIGH SENSITIVITY): Troponin I (High Sensitivity): 9 ng/L (ref <18)

## 2021-12-13 NOTE — ED Notes (Signed)
The pt advised last night while dreaming she had some palpitations. This morning after getting her granddaughter she advised she was feeling dizzy around 8:30 am.

## 2021-12-13 NOTE — ED Triage Notes (Signed)
Pt reports has been having palpitations since last heart attack in may last year, states unsure if she is having anxiety.  Denies pain or palpitations at this time.  +nausea.  NAD noted, RR even and unlabored.

## 2021-12-13 NOTE — ED Provider Notes (Signed)
[facility] Provider Note    Event Date/Time   First MD Initiated Contact with Patient 12/13/21 1053     (approximate)   History   Palpitations   HPI  Monique Greer is a 51 y.o. 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  She denies any associated chest pain, cough, fevers, shortness of breath, back pain, vomiting, diarrhea, urinary symptoms, acute abdominal pain or any new focal weakness numbness or tingling headaches or vertigo.  She states she thinks she takes her medications but is not entirely sure as to because she is having some difficulties with this.  She thinks he took her blood pressure medicine last night.  She does have a PCP and is scheduled to see a psychiatrist she informs me next month to help with her bipolar disorder.  Physical Exam  Triage Vital Signs: ED Triage Vitals [12/13/21 1047]  Enc Vitals Group     BP (!) 175/68     Pulse Rate 75     Resp 20     Temp 99.2 F (37.3 C)     Temp Source Oral     SpO2 95 %     Weight 205 lb (93 kg)     Height 5\' 3"  (1.6 m)     Head Circumference      Peak Flow      Pain Score 0     Pain Loc      Pain Edu?      Excl. in [place]?     Most recent vital signs: Vitals:   12/13/21 1047 12/13/21 1127  BP: (!) 175/68 133/77  Pulse: 75 75  Resp: 20 18  Temp: 99.2 F (37.3 C) 98 F (36.7 C)  SpO2: 95% 95%    General: Awake, no distress.  CV:  Good peripheral perfusion.  No murmurs rubs or gallops.  2+ radial pulses. Resp:  Normal effort.  Clear bilaterally. Abd:  No distention.  Soft  throughout. Other:  Patient is awake and alert moving all extremities spontaneously.  No significant lower extremity edema.  Cranial nerves II through XII grossly intact.  No pronator drift.  No finger dysmetria.  Symmetric 5/5 strength of all extremities.  Sensation intact to light touch in all extremities.  Unremarkable unassisted gait.    ED Results / Procedures / Treatments  Labs (all labs ordered are listed, but only abnormal results are displayed) Labs Reviewed  BASIC METABOLIC PANEL - Abnormal; Notable for the following components:      Result Value   Glucose, Bld 109 (*)    All other components within normal limits  CBC  MAGNESIUM  TSH  POC URINE PREG, ED  TROPONIN I (HIGH SENSITIVITY)     EKG  ECG is remarkable sinus rhythm with a ventricular rate of 68 and nonspecific ST change in lead III without any other clear evidence of acute ischemia or significant arrhythmia.   RADIOLOGY Chest reviewed by myself shows no focal consoidation, effusion, edema, pneumothorax or other clear acute thoracic process. I also reviewed radiology interpretation and agree with findings described.  PROCEDURES:  Critical Care performed: No  Procedures    MEDICATIONS ORDERED IN ED: Medications - No data to display   IMPRESSION / MDM / [place] / ED COURSE  I reviewed the triage vital signs and the nursing notes.                              Differential diagnosis includes, but is not limited to paroxysmal arrhythmia, anemia, metabolic derangements, hyperthyroidism which lower suspicion for toxic ingestion or withdrawal.  Patient is denying any other acute infectious symptoms I have a low suspicion for acute infectious process.  He does not seem intoxicated.  She is denying any chest pain and given reassuring EKG of a low suspicion for ACS.  ECG without evidence of any arrhythmia.  Patient is not had any chest pain or shortness of breath and has no tachypnea hypoxia or  tachycardia or other clear exam or struggle features to suggest a PE at this time.  Chest reviewed by myself shows no focal consoidation, effusion, edema, pneumothorax or other clear acute thoracic process. I also reviewed radiology interpretation and agree with findings described.  BMP without any significant electrolyte or metabolic derangements.  CBC without leukocytosis or acute anemia.  Museum and TSH are within normal limits.  Patient has no neurological deficits and is awake and alert and oriented and I have low suspicion for CVA.  Patient initially had endorsed some dizziness in triage.  She is denying this on several reassessments of this examiner.  Unclear etiology for palpitations although given stable vitals with otherwise reassuring exam and work-up I think she is stable for discharge with continued outpatient evaluation.  Discussed that her blood pressure initially was elevated on arrival but was normal on recheck and that she should have this rechecked as well which he follows up with her PCP.  Discharged in stable condition.  Strict return precautions advised and discussed.     FINAL CLINICAL IMPRESSION(S) / ED DIAGNOSES   Final diagnoses:  Palpitations     Rx / DC Orders   ED Discharge Orders     None        Note:  This document was prepared using Dragon voice recognition software and may include unintentional dictation errors.   Lucrezia Starch, MD 12/13/21 [phone]

## 2021-12-13 NOTE — ED Triage Notes (Addendum)
Pt comes into the ED via EMS from the side of the road, states she ran out of gas, reports to EMS that she was having heart palpitations last night, states her heart was stopping and would wake her up, pt has minor granddaughter with her, reports pt was anxious on there arrival  150/80 HR68 97%RA

## 2021-12-13 NOTE — Discharge Instructions (Addendum)
As we discussed please follow-up with your primary care physician.  Please return for any new or worsening of symptoms.
